# Patient Record
Sex: Female | Born: 1950
Health system: Southern US, Community
[De-identification: ages and names within clinical notes are randomized; demographics above are authoritative.]

## PROBLEM LIST (undated history)

## (undated) DIAGNOSIS — G4733 Obstructive sleep apnea (adult) (pediatric): Secondary | ICD-10-CM

## (undated) DIAGNOSIS — F330 Major depressive disorder, recurrent, mild: Secondary | ICD-10-CM

## (undated) DIAGNOSIS — E119 Type 2 diabetes mellitus without complications: Secondary | ICD-10-CM

## (undated) DIAGNOSIS — R001 Bradycardia, unspecified: Secondary | ICD-10-CM

## (undated) DIAGNOSIS — I639 Cerebral infarction, unspecified: Secondary | ICD-10-CM

## (undated) DIAGNOSIS — Z8673 Personal history of transient ischemic attack (TIA), and cerebral infarction without residual deficits: Secondary | ICD-10-CM

## (undated) DIAGNOSIS — I440 Atrioventricular block, first degree: Secondary | ICD-10-CM

## (undated) DIAGNOSIS — Z8739 Personal history of other diseases of the musculoskeletal system and connective tissue: Secondary | ICD-10-CM

## (undated) DIAGNOSIS — F3342 Major depressive disorder, recurrent, in full remission: Secondary | ICD-10-CM

## (undated) DIAGNOSIS — E785 Hyperlipidemia, unspecified: Secondary | ICD-10-CM

## (undated) DIAGNOSIS — F4310 Post-traumatic stress disorder, unspecified: Secondary | ICD-10-CM

## (undated) DIAGNOSIS — F32A Depression, unspecified: Secondary | ICD-10-CM

## (undated) DIAGNOSIS — K589 Irritable bowel syndrome without diarrhea: Secondary | ICD-10-CM

## (undated) DIAGNOSIS — G7111 Myotonic muscular dystrophy: Secondary | ICD-10-CM

## (undated) DIAGNOSIS — F329 Major depressive disorder, single episode, unspecified: Secondary | ICD-10-CM

## (undated) HISTORY — DX: Major depressive disorder, recurrent, mild: F33.0

## (undated) HISTORY — PX: OOPHORECTOMY: SHX86

## (undated) HISTORY — PX: OTHER SURGICAL HISTORY: SHX169

## (undated) HISTORY — DX: Obstructive sleep apnea (adult) (pediatric): G47.33

## (undated) HISTORY — DX: Bradycardia, unspecified: R00.1

## (undated) HISTORY — PX: RETINAL DETACHMENT SURGERY: SHX105

## (undated) HISTORY — PX: BRAIN SURGERY: SHX531

## (undated) HISTORY — DX: Hyperlipidemia, unspecified: E78.5

## (undated) HISTORY — DX: Major depressive disorder, single episode, unspecified: F32.9

## (undated) HISTORY — DX: Atrioventricular block, first degree: I44.0

## (undated) HISTORY — DX: Major depressive disorder, recurrent, in full remission: F33.42

## (undated) HISTORY — PX: COLONOSCOPY: SHX174

## (undated) HISTORY — DX: Post-traumatic stress disorder, unspecified: F43.10

## (undated) HISTORY — DX: Depression, unspecified: F32.A

## (undated) HISTORY — DX: Myotonic muscular dystrophy: G71.11

## (undated) HISTORY — DX: Personal history of other diseases of the musculoskeletal system and connective tissue: Z87.39

## (undated) HISTORY — PX: CATARACT EXTRACTION: SUR2

## (undated) HISTORY — DX: Irritable bowel syndrome without diarrhea: K58.9

## (undated) HISTORY — PX: TOTAL ABDOMINAL HYSTERECTOMY: SHX209

## (undated) HISTORY — DX: Personal history of transient ischemic attack (TIA), and cerebral infarction without residual deficits: Z86.73

---

## 2006-10-04 ENCOUNTER — Emergency Department: Payer: Self-pay | Admitting: Emergency Medicine

## 2009-07-09 ENCOUNTER — Emergency Department (HOSPITAL_COMMUNITY): Admission: EM | Admit: 2009-07-09 | Discharge: 2009-07-09 | Payer: Self-pay | Admitting: Emergency Medicine

## 2009-07-10 ENCOUNTER — Ambulatory Visit (HOSPITAL_COMMUNITY): Admission: RE | Admit: 2009-07-10 | Discharge: 2009-07-10 | Payer: Self-pay | Admitting: Emergency Medicine

## 2010-01-23 ENCOUNTER — Ambulatory Visit: Payer: Self-pay | Admitting: Family Medicine

## 2010-01-30 ENCOUNTER — Inpatient Hospital Stay: Payer: Self-pay | Admitting: Internal Medicine

## 2010-10-13 ENCOUNTER — Emergency Department: Payer: Self-pay | Admitting: Emergency Medicine

## 2011-05-21 ENCOUNTER — Encounter: Payer: Self-pay | Admitting: Family Medicine

## 2011-06-09 ENCOUNTER — Encounter: Payer: Self-pay | Admitting: Family Medicine

## 2011-10-25 ENCOUNTER — Ambulatory Visit: Payer: Self-pay | Admitting: Obstetrics and Gynecology

## 2011-10-28 ENCOUNTER — Inpatient Hospital Stay: Payer: Self-pay | Admitting: Internal Medicine

## 2011-10-28 LAB — CBC
MCH: 29.5 pg (ref 26.0–34.0)
MCV: 87 fL (ref 80–100)
Platelet: 268 10*3/uL (ref 150–440)
RDW: 14.1 % (ref 11.5–14.5)
WBC: 3.8 10*3/uL (ref 3.6–11.0)

## 2011-10-28 LAB — URINALYSIS, COMPLETE
Glucose,UR: NEGATIVE mg/dL (ref 0–75)
Nitrite: POSITIVE
Ph: 5 (ref 4.5–8.0)

## 2011-10-28 LAB — COMPREHENSIVE METABOLIC PANEL
Anion Gap: 14 (ref 7–16)
Calcium, Total: 8.9 mg/dL (ref 8.5–10.1)
EGFR (African American): >60
Glucose: 106 mg/dL — ABNORMAL HIGH (ref 65–99)
Osmolality: 287 (ref 275–301)
Potassium: 3.8 mmol/L (ref 3.5–5.1)
SGOT(AST): 54 U/L — ABNORMAL HIGH (ref 15–37)
Total Protein: 8.4 g/dL — ABNORMAL HIGH (ref 6.4–8.2)

## 2011-10-29 LAB — CBC WITH DIFFERENTIAL/PLATELET
Basophil #: 0 10*3/uL (ref 0.0–0.1)
Basophil %: 0.5 %
Eosinophil #: 0 10*3/uL (ref 0.0–0.7)
HCT: 38.2 % (ref 35.0–47.0)
HGB: 12.9 g/dL (ref 12.0–16.0)
MCH: 29.6 pg (ref 26.0–34.0)
MCHC: 33.9 g/dL (ref 32.0–36.0)
MCV: 87 fL (ref 80–100)
Monocyte #: 0.4 10*3/uL (ref 0.0–0.7)
Neutrophil %: 46.1 %
RBC: 4.37 10*6/uL (ref 3.80–5.20)
RDW: 14.2 % (ref 11.5–14.5)

## 2011-10-29 LAB — BASIC METABOLIC PANEL
BUN: 14 mg/dL (ref 7–18)
Calcium, Total: 8.3 mg/dL — ABNORMAL LOW (ref 8.5–10.1)
Creatinine: 0.98 mg/dL (ref 0.60–1.30)
EGFR (African American): >60
EGFR (Non-African Amer.): >60
Glucose: 78 mg/dL (ref 65–99)
Potassium: 3.5 mmol/L (ref 3.5–5.1)
Sodium: 143 mmol/L (ref 136–145)

## 2011-10-29 LAB — MAGNESIUM: Magnesium: 2 mg/dL

## 2011-10-30 LAB — BASIC METABOLIC PANEL
Anion Gap: 12 (ref 7–16)
Calcium, Total: 8.2 mg/dL — ABNORMAL LOW (ref 8.5–10.1)
Chloride: 104 mmol/L (ref 98–107)
Creatinine: 0.88 mg/dL (ref 0.60–1.30)
EGFR (African American): >60
EGFR (Non-African Amer.): >60
Osmolality: 285 (ref 275–301)

## 2011-10-30 LAB — CBC WITH DIFFERENTIAL/PLATELET
Basophil #: 0 10*3/uL (ref 0.0–0.1)
Eosinophil %: 1.3 %
Lymphocyte #: 1.9 10*3/uL (ref 1.0–3.6)
Lymphocyte %: 44.4 %
MCV: 87 fL (ref 80–100)
Monocyte %: 6.7 %
Neutrophil #: 2 10*3/uL (ref 1.4–6.5)
Platelet: 251 10*3/uL (ref 150–440)
RDW: 13.9 % (ref 11.5–14.5)
WBC: 4.2 10*3/uL (ref 3.6–11.0)

## 2011-10-30 LAB — URINE CULTURE

## 2011-11-01 LAB — CBC WITH DIFFERENTIAL/PLATELET
Basophil #: 0 10*3/uL (ref 0.0–0.1)
HCT: 41.2 % (ref 35.0–47.0)
Lymphocyte %: 40.9 %
MCHC: 33.7 g/dL (ref 32.0–36.0)
Neutrophil #: 2.6 10*3/uL (ref 1.4–6.5)
RBC: 4.77 10*6/uL (ref 3.80–5.20)
RDW: 13.6 % (ref 11.5–14.5)

## 2011-11-01 LAB — BASIC METABOLIC PANEL
Anion Gap: 10 (ref 7–16)
BUN: 12 mg/dL (ref 7–18)
Co2: 30 mmol/L (ref 21–32)
Creatinine: 0.88 mg/dL (ref 0.60–1.30)
EGFR (African American): >60
EGFR (Non-African Amer.): >60
Sodium: 141 mmol/L (ref 136–145)

## 2011-11-01 LAB — HEPATIC FUNCTION PANEL A (ARMC)
Albumin: 3.2 g/dL — ABNORMAL LOW (ref 3.4–5.0)
Alkaline Phosphatase: 63 U/L (ref 50–136)
Bilirubin, Direct: 0.6 mg/dL — ABNORMAL HIGH (ref 0.00–0.20)
SGOT(AST): 69 U/L — ABNORMAL HIGH (ref 15–37)
SGPT (ALT): 77 U/L
Total Protein: 7.5 g/dL (ref 6.4–8.2)

## 2011-11-02 LAB — BASIC METABOLIC PANEL
Calcium, Total: 8.7 mg/dL (ref 8.5–10.1)
Glucose: 80 mg/dL (ref 65–99)
Potassium: 3.7 mmol/L (ref 3.5–5.1)
Sodium: 143 mmol/L (ref 136–145)

## 2011-11-02 LAB — CBC WITH DIFFERENTIAL/PLATELET
Basophil #: 0 10*3/uL (ref 0.0–0.1)
Eosinophil #: 0.1 10*3/uL (ref 0.0–0.7)
MCH: 29.2 pg (ref 26.0–34.0)
MCHC: 34 g/dL (ref 32.0–36.0)
Monocyte #: 0.4 10*3/uL (ref 0.0–0.7)
Neutrophil %: 45.7 %
Platelet: 294 10*3/uL (ref 150–440)
RDW: 13.9 % (ref 11.5–14.5)
WBC: 5.2 10*3/uL (ref 3.6–11.0)

## 2011-11-02 LAB — HEPATIC FUNCTION PANEL A (ARMC)
Bilirubin, Direct: 0.4 mg/dL — ABNORMAL HIGH (ref 0.00–0.20)
Bilirubin,Total: 0.9 mg/dL (ref 0.2–1.0)
SGPT (ALT): 66 U/L

## 2011-11-03 LAB — BASIC METABOLIC PANEL
Calcium, Total: 8.7 mg/dL (ref 8.5–10.1)
Creatinine: 1 mg/dL (ref 0.60–1.30)
EGFR (African American): >60
EGFR (Non-African Amer.): >60
Glucose: 85 mg/dL (ref 65–99)
Potassium: 3.3 mmol/L — ABNORMAL LOW (ref 3.5–5.1)
Sodium: 144 mmol/L (ref 136–145)

## 2011-11-03 LAB — CLOSTRIDIUM DIFFICILE BY PCR

## 2011-11-03 LAB — CBC WITH DIFFERENTIAL/PLATELET
Basophil #: 0 10*3/uL (ref 0.0–0.1)
Eosinophil #: 0.1 10*3/uL (ref 0.0–0.7)
Eosinophil %: 3 %
HCT: 41.6 % (ref 35.0–47.0)
HGB: 13.8 g/dL (ref 12.0–16.0)
MCH: 29 pg (ref 26.0–34.0)
MCHC: 33.2 g/dL (ref 32.0–36.0)
MCV: 87 fL (ref 80–100)
Monocyte #: 0.3 10*3/uL (ref 0.0–0.7)
Neutrophil #: 2.2 10*3/uL (ref 1.4–6.5)
Neutrophil %: 46.9 %
Platelet: 293 10*3/uL (ref 150–440)
RBC: 4.77 10*6/uL (ref 3.80–5.20)

## 2011-11-03 LAB — WBCS, STOOL

## 2011-12-04 ENCOUNTER — Ambulatory Visit: Payer: Self-pay | Admitting: Internal Medicine

## 2011-12-06 ENCOUNTER — Ambulatory Visit: Payer: Self-pay | Admitting: Internal Medicine

## 2011-12-11 ENCOUNTER — Emergency Department: Payer: Self-pay | Admitting: Emergency Medicine

## 2011-12-11 LAB — COMPREHENSIVE METABOLIC PANEL
Albumin: 3.8 g/dL (ref 3.4–5.0)
Alkaline Phosphatase: 83 U/L (ref 50–136)
Anion Gap: 15 (ref 7–16)
BUN: 23 mg/dL — ABNORMAL HIGH (ref 7–18)
Bilirubin,Total: 1 mg/dL (ref 0.2–1.0)
Calcium, Total: 9.7 mg/dL (ref 8.5–10.1)
Chloride: 99 mmol/L (ref 98–107)
Co2: 26 mmol/L (ref 21–32)
EGFR (Non-African Amer.): >60
Osmolality: 284 (ref 275–301)
Potassium: 3.8 mmol/L (ref 3.5–5.1)
SGPT (ALT): 52 U/L

## 2011-12-11 LAB — CBC
HGB: 16.7 g/dL — ABNORMAL HIGH (ref 12.0–16.0)
Platelet: 300 10*3/uL (ref 150–440)
RBC: 5.72 10*6/uL — ABNORMAL HIGH (ref 3.80–5.20)
RDW: 14 % (ref 11.5–14.5)
WBC: 6.1 10*3/uL (ref 3.6–11.0)

## 2011-12-11 LAB — RAPID INFLUENZA A&B ANTIGENS

## 2012-05-14 ENCOUNTER — Ambulatory Visit: Payer: Self-pay | Admitting: Obstetrics and Gynecology

## 2012-05-23 IMAGING — MG MM CAD SCREENING MAMMO
1 series · 6 of 6 positions shown · non-contrast
Comparison: none

REASON FOR EXAM: scr mammo no order
COMMENTS:

[Series 1259: R CC · right · 6 of 6 slices shown]
[im 1/6]
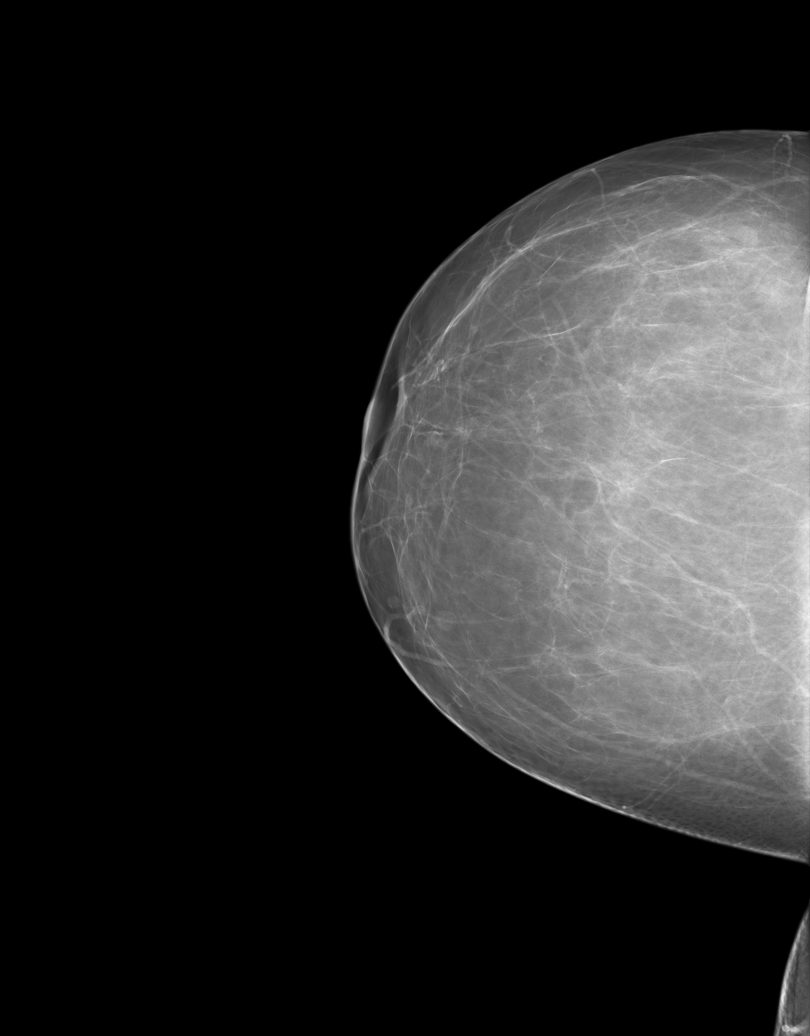
[im 2/6]
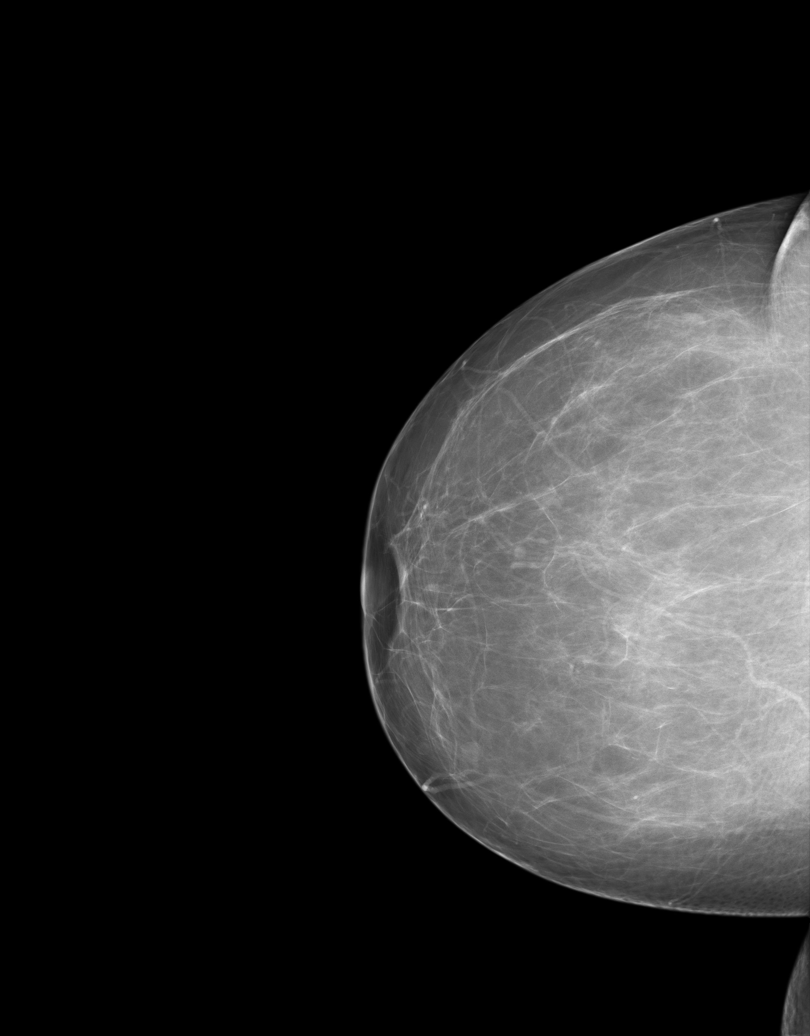
[im 3/6]
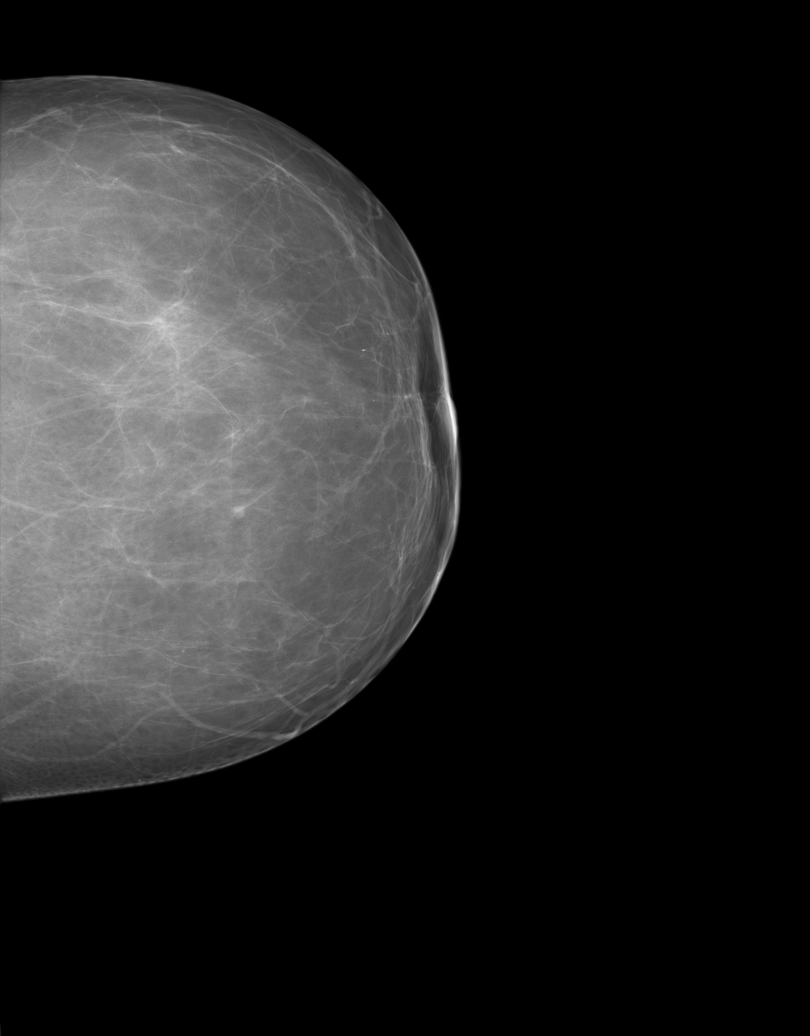
[im 4/6]
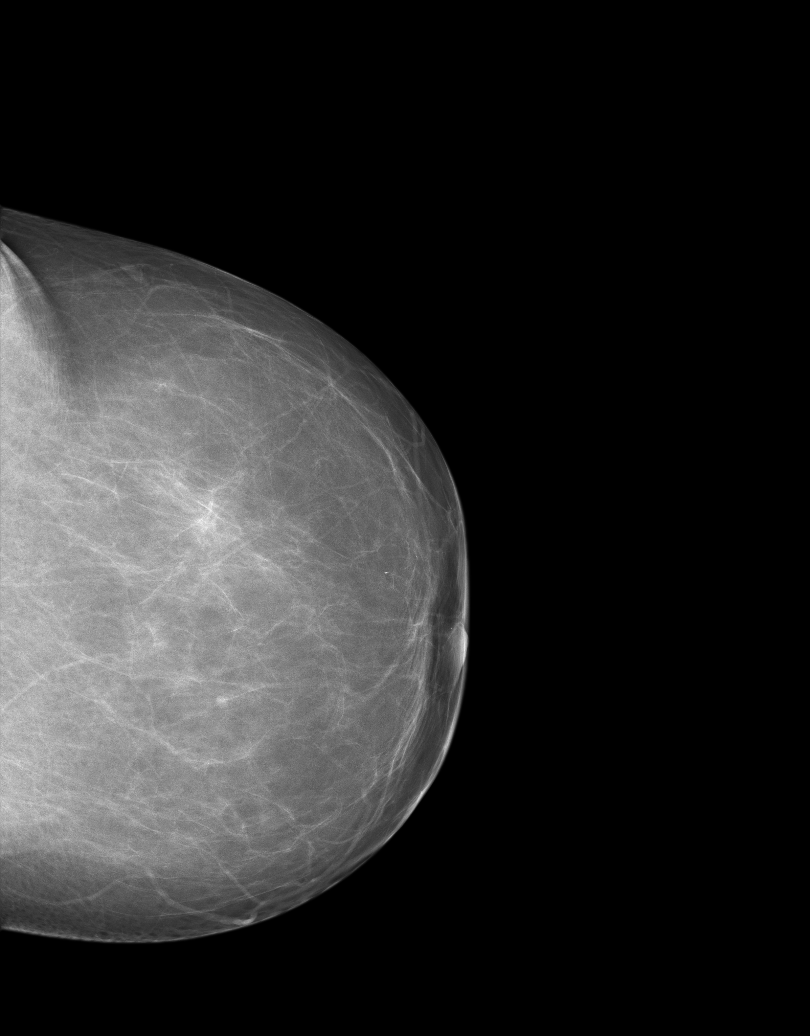
[im 5/6]
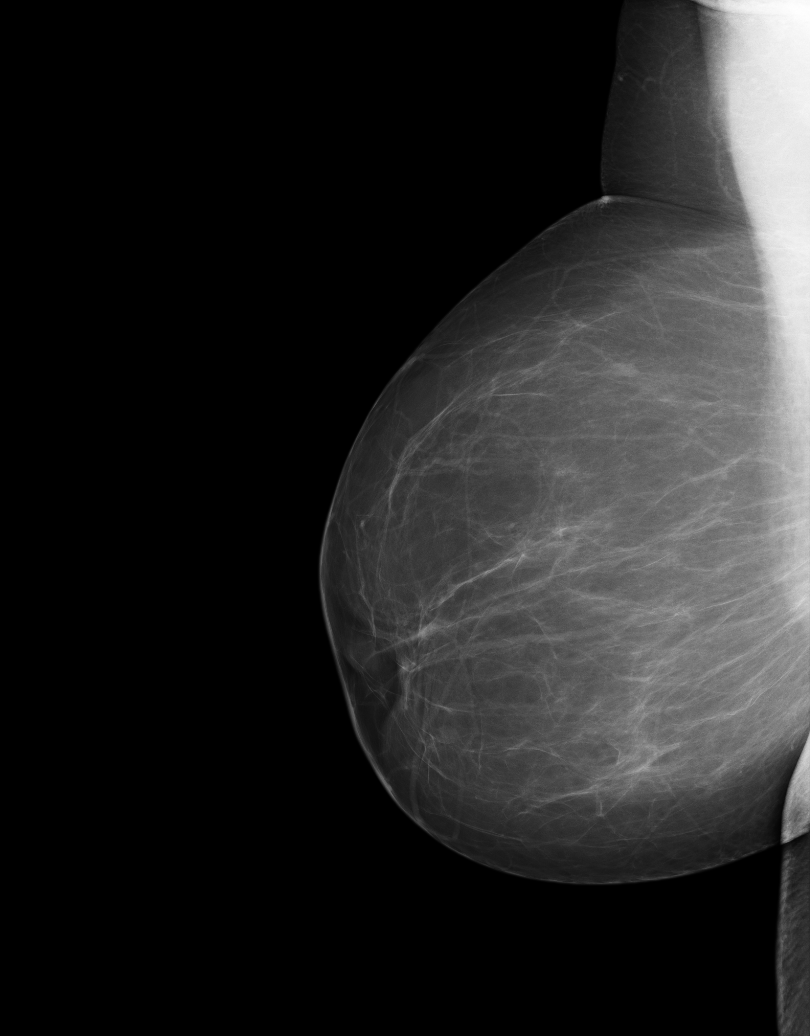
[im 6/6]
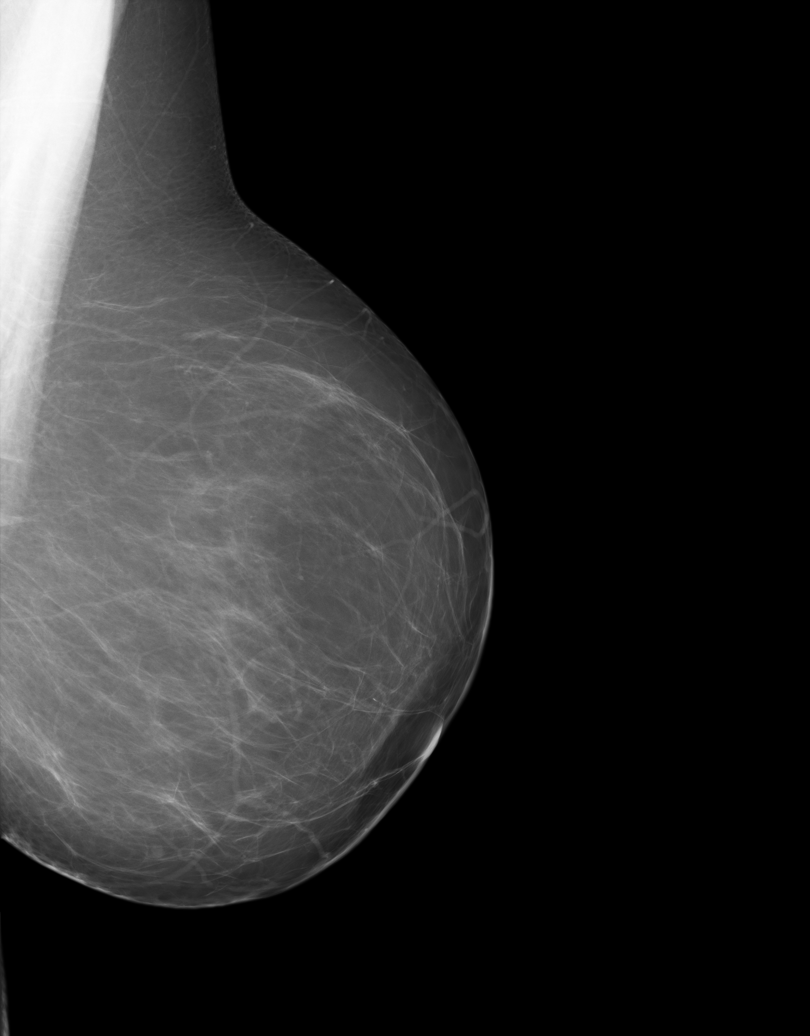

[6 of 6 positions shown; findings below may reference images not displayed]

PROCEDURE:     MAM - MAM DGTL SCRN MAM NO ORDER W/CAD  - December 04, 2011 [DATE]

RESULT:

No dominant masses or pathologic clustered calcifications demonstrated.
Nodular densities noted along the medial portion of the right breast. It is
suggested the patient return for compression spot films for further
evaluation. Nodularity noted elsewhere in both breasts is stable. The
breasts are primarily fatty replaced. No pathologic calcifications noted.
IMPRESSION: Nodular densities noted in the medial portion of the right
breast for which compression spot films and, if need be, ultrasound is
suggested for further evaluation.

BI-RADS:  Category 0 - Needs Additional Imaging Evaluation

Thank you for this opportunity to contribute to the care of your patient.

A NEGATIVE MAMMOGRAM REPORT DOES NOT PRECLUDE BIOPSY OR OTHER EVALUATION OF
A CLINICALLY PALPABLE OR OTHERWISE SUSPICIOUS MASS OR LESION. BREAST CANCER
MAY NOT BE DETECTED BY MAMMOGRAPHY IN UP TO 10% OF CASES.

## 2012-05-30 IMAGING — CR DG CHEST 2V
1 series · 2 of 2 positions shown · non-contrast
Comparison: none

REASON FOR EXAM: cough and desaturations
COMMENTS:

[Series 1: x chest ap · 0.14mm/px · 2 of 2 slices shown]
[im 1/2]
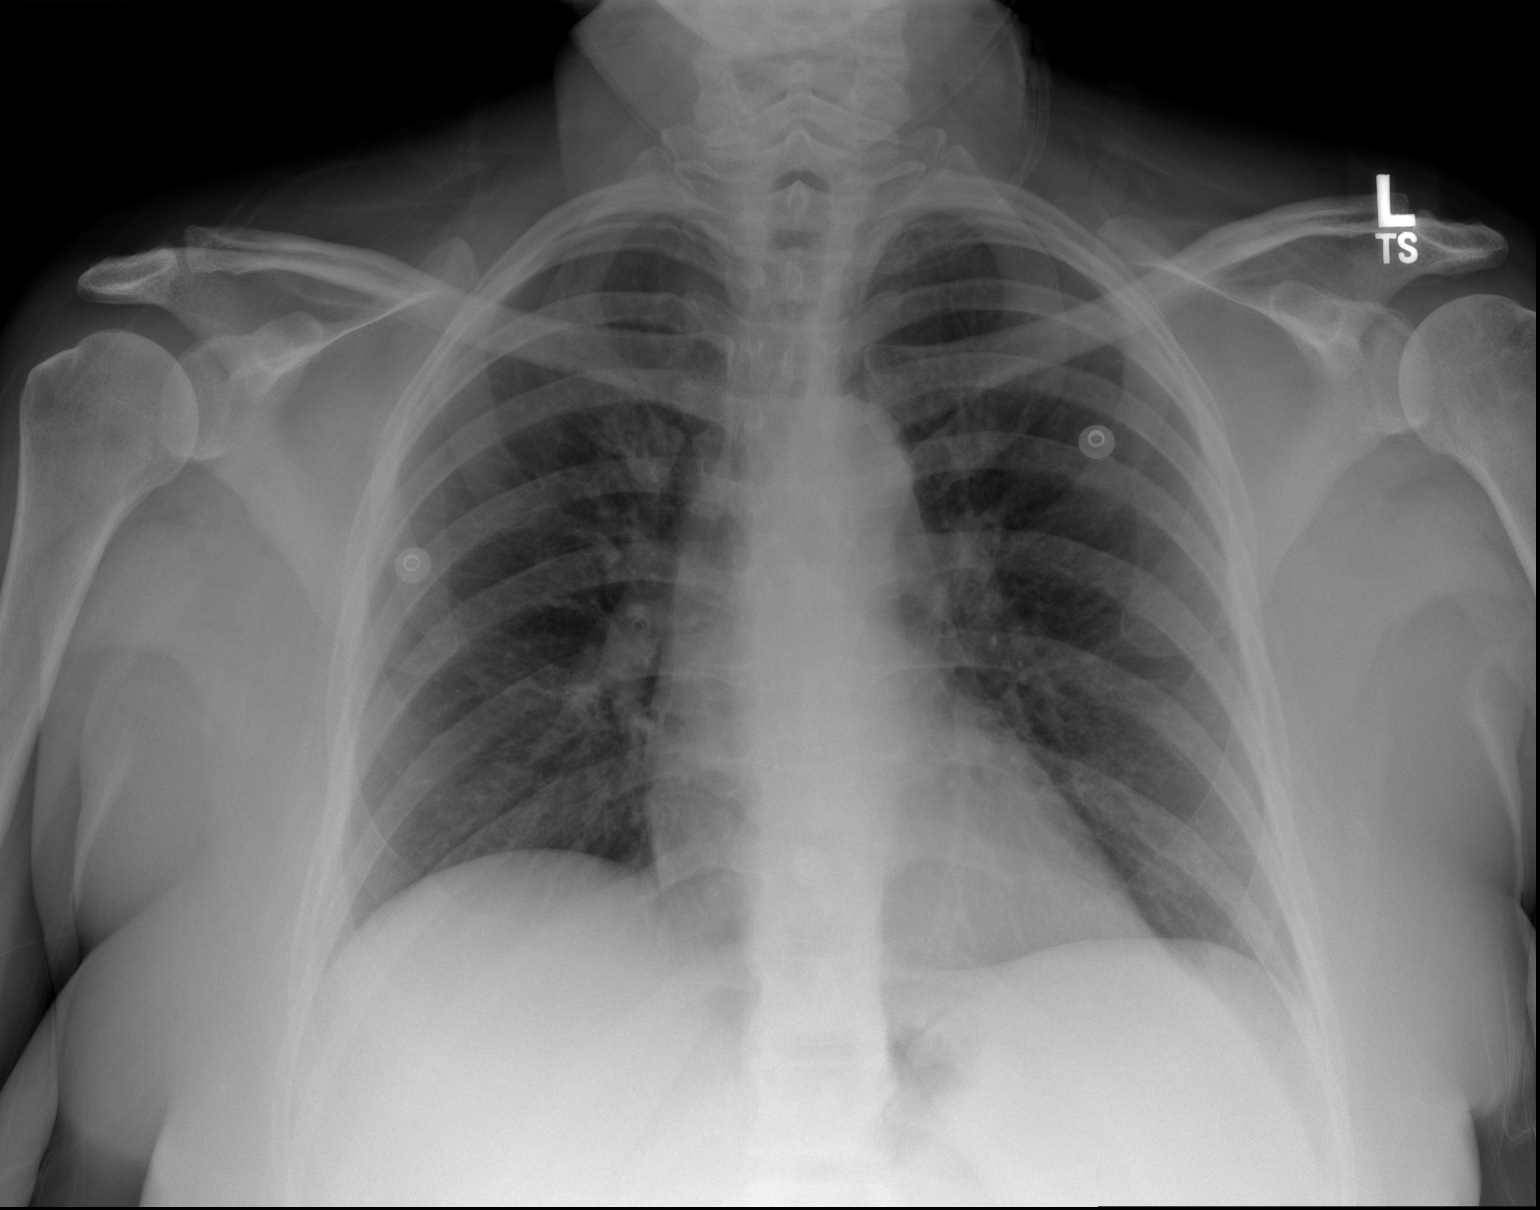
[im 2/2]
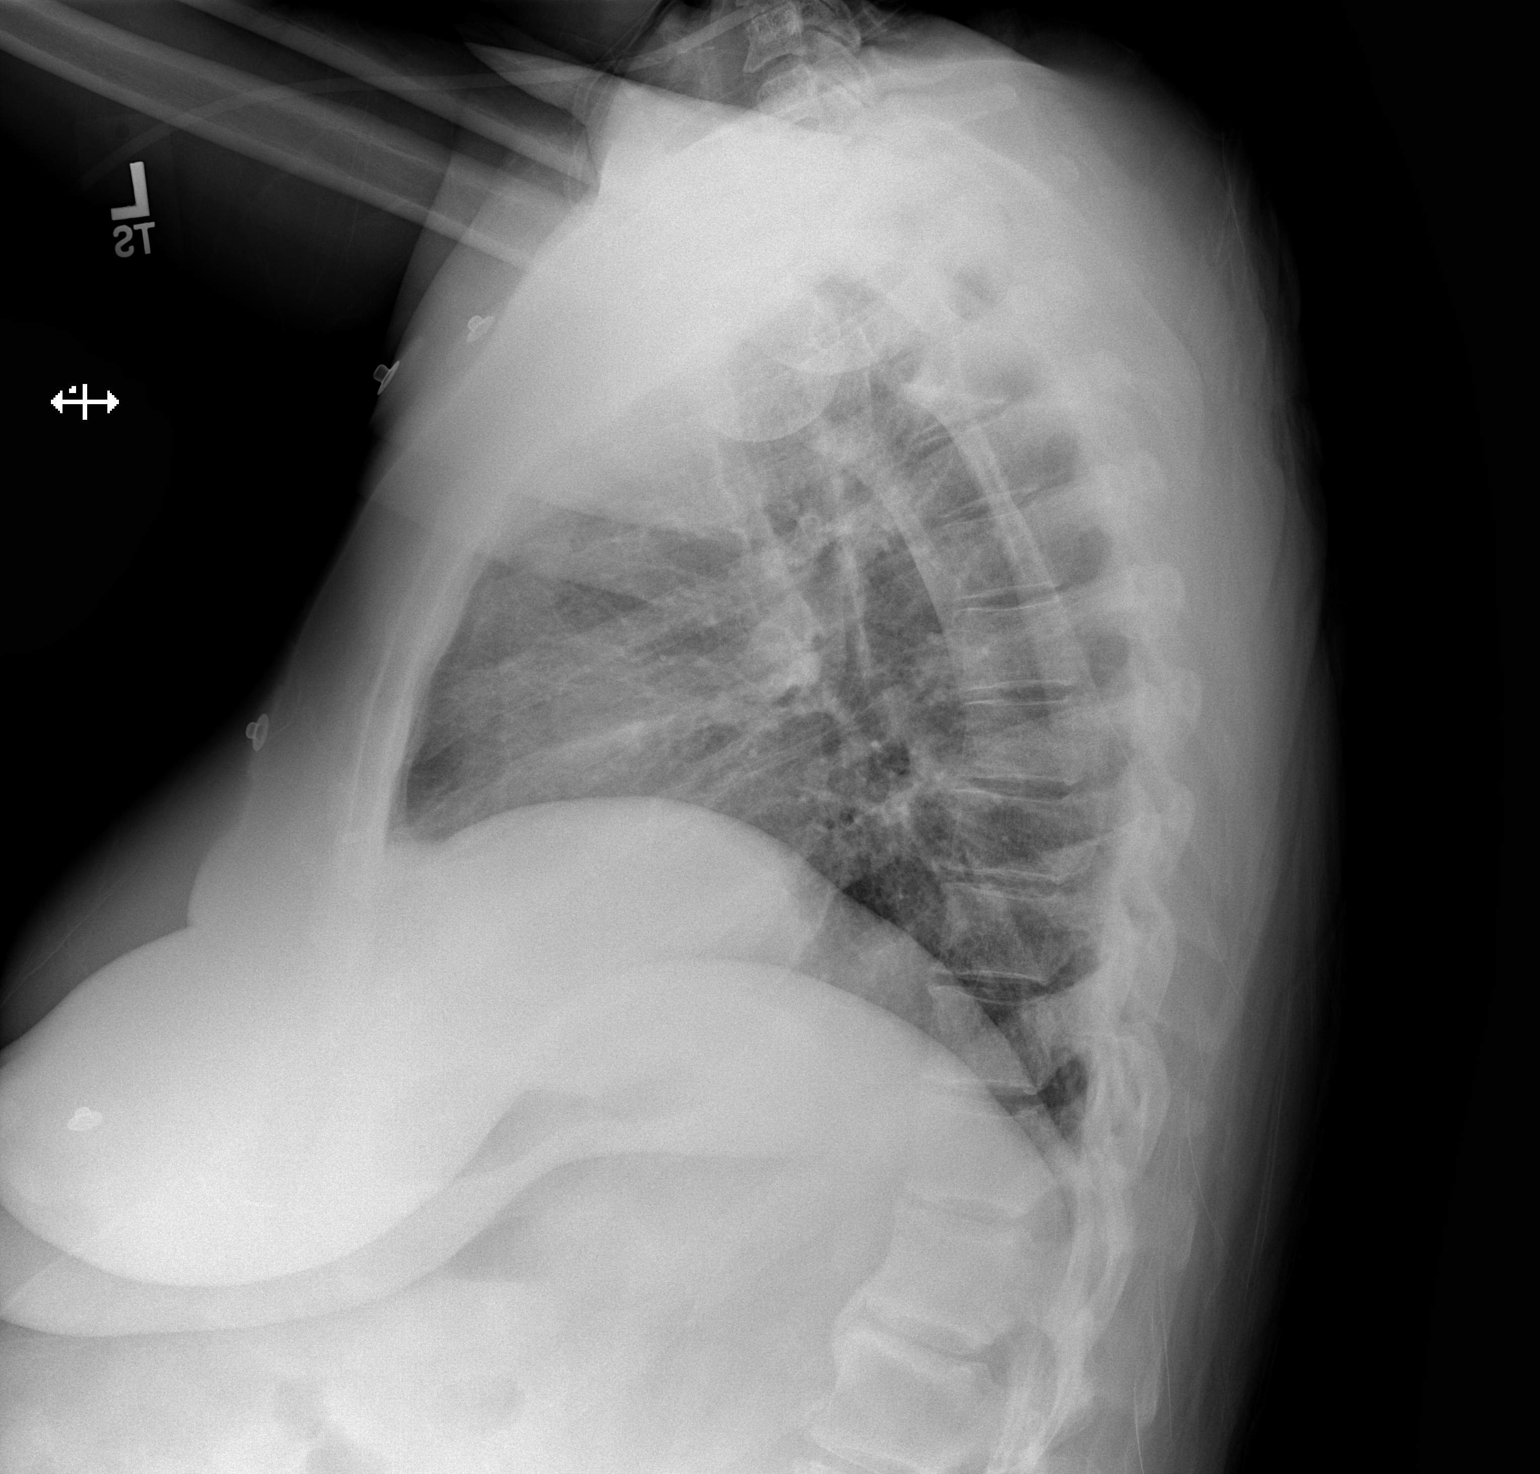

[2 of 2 positions shown; findings below may reference images not displayed]

PROCEDURE:     DXR - DXR CHEST PA (OR AP) AND LATERAL  - December 11, 2011 [DATE]

RESULT:     Comparison made to the previous exam dated 28 October, 2011.

The lungs are clear. The heart and pulmonary vessels are normal. The bony
and mediastinal structures are unremarkable. There is no effusion. There is
no pneumothorax or evidence of congestive failure.
IMPRESSION: No acute cardiopulmonary disease.

## 2012-09-19 DIAGNOSIS — G7111 Myotonic muscular dystrophy: Secondary | ICD-10-CM | POA: Insufficient documentation

## 2012-10-10 DIAGNOSIS — G4733 Obstructive sleep apnea (adult) (pediatric): Secondary | ICD-10-CM | POA: Insufficient documentation

## 2012-10-10 DIAGNOSIS — M889 Osteitis deformans of unspecified bone: Secondary | ICD-10-CM | POA: Insufficient documentation

## 2012-10-10 DIAGNOSIS — E785 Hyperlipidemia, unspecified: Secondary | ICD-10-CM | POA: Insufficient documentation

## 2012-10-10 DIAGNOSIS — K589 Irritable bowel syndrome without diarrhea: Secondary | ICD-10-CM | POA: Insufficient documentation

## 2012-10-10 DIAGNOSIS — Z8673 Personal history of transient ischemic attack (TIA), and cerebral infarction without residual deficits: Secondary | ICD-10-CM | POA: Insufficient documentation

## 2012-10-10 DIAGNOSIS — F3340 Major depressive disorder, recurrent, in remission, unspecified: Secondary | ICD-10-CM | POA: Insufficient documentation

## 2012-10-10 DIAGNOSIS — F329 Major depressive disorder, single episode, unspecified: Secondary | ICD-10-CM | POA: Insufficient documentation

## 2012-10-10 DIAGNOSIS — R0602 Shortness of breath: Secondary | ICD-10-CM | POA: Insufficient documentation

## 2012-10-30 DIAGNOSIS — R001 Bradycardia, unspecified: Secondary | ICD-10-CM | POA: Insufficient documentation

## 2012-10-30 DIAGNOSIS — I499 Cardiac arrhythmia, unspecified: Secondary | ICD-10-CM | POA: Insufficient documentation

## 2012-12-04 ENCOUNTER — Ambulatory Visit: Payer: Self-pay | Admitting: Internal Medicine

## 2013-03-25 ENCOUNTER — Ambulatory Visit: Payer: Self-pay | Admitting: Physical Medicine and Rehabilitation

## 2013-08-18 ENCOUNTER — Ambulatory Visit: Payer: Self-pay | Admitting: Gastroenterology

## 2013-09-25 DIAGNOSIS — I440 Atrioventricular block, first degree: Secondary | ICD-10-CM | POA: Insufficient documentation

## 2013-10-16 ENCOUNTER — Encounter (INDEPENDENT_AMBULATORY_CARE_PROVIDER_SITE_OTHER): Payer: Self-pay

## 2013-10-16 ENCOUNTER — Ambulatory Visit (INDEPENDENT_AMBULATORY_CARE_PROVIDER_SITE_OTHER): Payer: Medicare PPO | Admitting: Cardiovascular Disease

## 2013-10-16 ENCOUNTER — Encounter: Payer: Self-pay | Admitting: *Deleted

## 2013-10-16 VITALS — BP 108/72 | HR 63 | Ht 67.5 in | Wt 190.2 lb

## 2013-10-16 DIAGNOSIS — R001 Bradycardia, unspecified: Secondary | ICD-10-CM | POA: Insufficient documentation

## 2013-10-16 DIAGNOSIS — I639 Cerebral infarction, unspecified: Secondary | ICD-10-CM

## 2013-10-16 DIAGNOSIS — E785 Hyperlipidemia, unspecified: Secondary | ICD-10-CM

## 2013-10-16 DIAGNOSIS — I6350 Cerebral infarction due to unspecified occlusion or stenosis of unspecified cerebral artery: Secondary | ICD-10-CM

## 2013-10-16 DIAGNOSIS — R0602 Shortness of breath: Secondary | ICD-10-CM

## 2013-10-16 DIAGNOSIS — G7111 Myotonic muscular dystrophy: Secondary | ICD-10-CM

## 2013-10-16 DIAGNOSIS — I498 Other specified cardiac arrhythmias: Secondary | ICD-10-CM

## 2013-10-16 NOTE — Progress Notes (Signed)
Patient ID: Rebekah Warner, female    DOB: August 20, 1951, 63 y.o.   MRN: [id number]  HPI Comments: Patient profile: Rebekah Warner is an 63 y.o. 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 Prior history of Paget's disease with complications and prolonged hospital course. Hyperlipidemia, CVA after a fall while in the rehabilitation. Her son died unexpectedly in sleep in 05-17-13 She had cognitive impairment after stroke, but that is getting better, still short term memory problems. She has trouble speaking getting the words out. She has muscle pains in upper arms, stiffness and pain in right shoulder, neck., hands hurt at night. She fell few times since the last visit, walks in leaves with a hole, stumbled and fell, she bruised her knees. If she falls she cannot get up on her own, she cannot bend over.  Swallowing more difficult, feels like propulsion is very slow, chokes easily during sleep and coughs then a lot, likely phlegm aspiration, no respiratory infections.  Previously seen at [facility]. Much of the details below provided from the [facility] hospital notes (care anywhere). She reports that she has had progressive difficulty walking, now requires someone to walk with in addition to a walker. She quit working in 1998 as she was unable to walk well. She has been told she has low heart rate for many years, in general has chronic fatigue, shortness of breath, worse with exertion. She does not feel particular asymptomatic from her bradycardia. She reports that she has had a Holter every year that has shown bradycardia. Heart rate seems to improve with exertion. Started back on her CPAP in September 2014 with a new machine. Seems to be tolerating this well. She feels better, no wheezing, less fatigued during the day. Her  family members also have MD. No episodes of syncope. Does have occasional lightheadedness.  EKG on today's visit shows normal sinus rhythm with rate 63 beats per minute, low voltage Recent lab work shows total cholesterol 156, LDL 77, triglycerides 217  Problem List: 1. Shortness of breath, chest pains a. Stress MRI 04/14/2009: Normal LVEF. No ischemia or infarction. No scar or infiltrative disease. b. MRI 10/2012: EF 65%, no scar, mild AI. 2. Myotonic muscular dystrophy type 1, based on genetic testing, EMG, and exam ECG a. 04/01/2009: SR, 73/min, PR 172, noncpecific ST elevation V1-2 b. 12/04/2009: SR, 97/min, PR 176, nonspecific T changes I, avL, V2-6. c. 09/19/2012: new AV block type 1 with PR of 204 ms, and sinus bradycardia 47/min, nonspecific T changes I, avL, V2,  d. 04/17/2013: AV block type 1 with PR of 208 ms, and sinus rhythm 58/min, nonspecific T changes I, avL, V2, HOLTER a. 04/30/2008: SR, sinus-bradycardia, min 46/min, max. 96/min, avg 62/min, 15 PACs, 8 atrial pairs. b. 04/14/2009: SR, sinus-bradycardia, min 47/min, max. 105/min, avg 66/min, 16 PACs, 3 atrial pairs, 2 runs of atrial tachycardia, 5 beats @114 /min, no pauses >2s c. 10/10/2012: SR, sinus-bradycardia, min 45/min, max. 88/min, avg 59/min, 73 PACs, 7 atrial pairs, 4 runs of AT max 4 beats max. 138/min, no other supraventricular or ventricular arrhythmia. no pauses >2s, max RR 1.4s, d. 09/16/2013: SR, sinus-bradycardia, min 47/min, max. 100/min, avg 67/min, AV block I, 3PVCs 29 PACs, 7 atrial pairs, 15 runs of AT max 4 beats max. 133/min, no other supraventricular or ventricular arrhythmia. no pauses >2s, max  RR 1.4s, 3. Mixed hyperlipidemia / hypertriglyceridemia  4. Depression  5. Paget disease s/p retromastoid craniotomy 11/2009 of hyperostosis complicated by postoperative cereballar hematoma and left occipital lobe ischemic infaction, recurrent infarction 02/2010 6. Obstructive sleep apnea Titrated to BIPAP 25/20 cmH2O,  started 06/2013  PAST SURGICAL HISTORY:  1. Endometriosis status post hysterectomy / ovarectomy 1993  2. Cataract laser surgery 2002   Social History: Patient is married, is disabled due to her muscular weakness. She denies regular intake of alcoholic beverages, does not smoke, follows a fat, sugar, salt restricted diet. She is not able to exercise.   Family History: She is adopted and her family history is noncontributory. She does have three children. Two daughters who both are diagnosed with myotonic muscular dystrophy and a son who does not have muscular dystrophy, suddenly during sleep at age 26 in August of this year.   Outpatient Encounter Prescriptions as of 10/16/2013  Medication Sig  . buPROPion (WELLBUTRIN XL) 150 MG 24 hr tablet Take 150 mg by mouth daily.  Marland Kitchen ezetimibe (ZETIA) 10 MG tablet Take 10 mg by mouth daily.  . fenofibrate 160 MG tablet Take 160 mg by mouth daily.  . pregabalin (LYRICA) 100 MG capsule Take 100 mg by mouth daily.  . QUEtiapine (SEROQUEL) 25 MG tablet Take 25 mg by mouth at bedtime.  . sertraline (ZOLOFT) 100 MG tablet Take 100 mg by mouth daily.  . traMADol (ULTRAM) 50 MG tablet Take by mouth every 6 (six) hours as needed.  . zolpidem (AMBIEN) 5 MG tablet Take 5 mg by mouth at bedtime as needed for sleep.     Review of Systems  Constitutional: Negative.   HENT: Negative.   Eyes: Negative.   Respiratory: Positive for shortness of breath.   Cardiovascular: Negative.   Gastrointestinal: Negative.   Endocrine: Negative.   Musculoskeletal: Positive for gait problem and myalgias.  Skin: Negative.   Allergic/Immunologic: Negative.   Neurological: Negative.   Hematological: Negative.   Psychiatric/Behavioral: Negative.     BP 108/72  Pulse 63  Ht 5' 7.5" (1.715 m)  Wt 190 lb 4 oz (86.297 kg)  BMI 29.34 kg/m2  Physical Exam  Nursing note and vitals reviewed. Constitutional: She is oriented to person, place, and time. She appears  well-developed and well-nourished.  HENT:  Head: Normocephalic.  Nose: Nose normal.  Mouth/Throat: Oropharynx is clear and moist.  Eyes: Conjunctivae are normal. Pupils are equal, round, and reactive to light.  Neck: Normal range of motion. Neck supple. No JVD present.  Cardiovascular: Normal rate, regular rhythm, S1 normal, S2 normal, normal heart sounds and intact distal pulses.  Exam reveals no gallop and no friction rub.   No murmur heard. Pulmonary/Chest: Effort normal and breath sounds normal. No respiratory distress. She has no wheezes. She has no rales. She exhibits no tenderness.  Abdominal: Soft. Bowel sounds are normal. She exhibits no distension. There is no tenderness.  Musculoskeletal: Normal range of motion. She exhibits no edema and no tenderness.  Lymphadenopathy:    She has no cervical adenopathy.  Neurological: She is alert and oriented to person, place, and time. Coordination normal.  Skin: Skin is warm and dry. No rash noted. No erythema.  Psychiatric: She has a normal mood and affect. Her behavior is normal. Judgment and thought content normal.    Assessment and Plan

## 2013-10-16 NOTE — Assessment & Plan Note (Addendum)
Assessment and Plan: 1. Conduction abnormalities - Per [facility] notes, she has a Hx of AV conduction problems and sinus bradycardia ( noted in 10/2012). Hx of sleep apnea, and she previously not  using her CPAP. referral to pulmonology in 2014 with assessment and adjustment of her CPAP machine, she saw Dr Kathline Magic and received  a CPAP machine in 06/2013 which she uses regularly.  Previous 48 hr Holter monitor SR, sinus-bradycardia, min 47/min, max. 100/min, avg 67/min, AV block I, 3PVCs 29 PACs, 7 atrial pairs, 15 runs of AT max 4 beats max. 133/min, no other supraventricular or ventricular arrhythmia. no pauses >2s, max RR 1.4s.  She has no Class I indication for PM (no third degree or advanced second-degree block, and no symptoms attributable to bradycardia.   Neuromuscular disease and any degree of AV-block, or bifascicular/any fascicular block, with or without symptoms are Class IIb indication for PM.  previous MRI at [facility] showed  no evidence of cardiomyopathy. Suggested we continue with watchful waiting and ECG / Holter as needed for symptoms.

## 2013-10-16 NOTE — Patient Instructions (Signed)
You are doing well. No medication changes were made.  Please call us if you have new issues that need to be addressed before your next appt.  Your physician wants you to follow-up in: 12 months.  You will receive a reminder letter in the mail two months in advance. If you don't receive a letter, please call our office to schedule the follow-up appointment. 

## 2013-10-22 ENCOUNTER — Encounter: Payer: Self-pay | Admitting: *Deleted

## 2013-11-02 DIAGNOSIS — G7111 Myotonic muscular dystrophy: Secondary | ICD-10-CM | POA: Insufficient documentation

## 2013-11-02 DIAGNOSIS — I639 Cerebral infarction, unspecified: Secondary | ICD-10-CM | POA: Insufficient documentation

## 2013-11-02 DIAGNOSIS — E785 Hyperlipidemia, unspecified: Secondary | ICD-10-CM | POA: Insufficient documentation

## 2013-11-02 DIAGNOSIS — I6350 Cerebral infarction due to unspecified occlusion or stenosis of unspecified cerebral artery: Secondary | ICD-10-CM | POA: Insufficient documentation

## 2013-11-02 NOTE — Assessment & Plan Note (Signed)
Continue on zetia. Avoiding statins secondary to current muscle weakness, myotonic muscular dystrophy.

## 2013-11-02 NOTE — Assessment & Plan Note (Signed)
She has muscle pains in upper arms, stiffness and pain in right shoulder, neck., hands hurt at night. Walks with a walker, support from other people.  Diagnosis in 1998. Stopped work at that time.

## 2013-11-02 NOTE — Assessment & Plan Note (Addendum)
She had cognitive impairment after stroke, but that is getting better, still short term memory problems. She has trouble speaking getting the words out.  Slow recovery. No documentation of PVD or arrhythmia.

## 2013-12-07 ENCOUNTER — Telehealth: Payer: Self-pay

## 2013-12-07 ENCOUNTER — Ambulatory Visit: Payer: Self-pay | Admitting: Family Medicine

## 2013-12-07 NOTE — Telephone Encounter (Signed)
Pt called and states he neurologist(Dr. Melrose Nakayama) wants her to start taking a medication to help relax her muscles, he advised that pt check with [place] before he prescribes. Please call.

## 2013-12-07 NOTE — Telephone Encounter (Signed)
Spoke w/ pt.  She reports that the name of the med is "nexilitine".

## 2013-12-08 NOTE — Telephone Encounter (Signed)
She has had conduction issues before I wonder if she is talking about mexilitine? If this is the case, I would be concerned. If she needs to take his medication, she may want to talk with one of our electrical doctor's first, Dr. Caryl Comes in clinic Uncertain if this medication could worsen her conduction block

## 2013-12-09 NOTE — Telephone Encounter (Signed)
Left message for pt to call back  °

## 2013-12-10 NOTE — Telephone Encounter (Signed)
Spoke w/ pt.  Advised her of Dr. Donivan Scull recommendation. She is agreeable and states that she will not take the medication.

## 2014-03-02 ENCOUNTER — Ambulatory Visit: Payer: Self-pay

## 2014-03-08 ENCOUNTER — Ambulatory Visit: Payer: Self-pay

## 2014-04-07 ENCOUNTER — Ambulatory Visit: Payer: Self-pay

## 2014-04-26 DIAGNOSIS — E669 Obesity, unspecified: Secondary | ICD-10-CM | POA: Insufficient documentation

## 2014-04-26 DIAGNOSIS — R5383 Other fatigue: Secondary | ICD-10-CM | POA: Insufficient documentation

## 2014-04-26 DIAGNOSIS — H539 Unspecified visual disturbance: Secondary | ICD-10-CM | POA: Insufficient documentation

## 2014-05-08 ENCOUNTER — Ambulatory Visit: Payer: Self-pay

## 2014-08-11 DIAGNOSIS — R7303 Prediabetes: Secondary | ICD-10-CM | POA: Insufficient documentation

## 2014-08-11 DIAGNOSIS — G4700 Insomnia, unspecified: Secondary | ICD-10-CM | POA: Insufficient documentation

## 2014-08-11 DIAGNOSIS — E119 Type 2 diabetes mellitus without complications: Secondary | ICD-10-CM | POA: Insufficient documentation

## 2014-09-29 DIAGNOSIS — G119 Hereditary ataxia, unspecified: Secondary | ICD-10-CM | POA: Insufficient documentation

## 2014-10-13 DIAGNOSIS — G4733 Obstructive sleep apnea (adult) (pediatric): Secondary | ICD-10-CM | POA: Diagnosis not present

## 2014-10-18 ENCOUNTER — Ambulatory Visit (INDEPENDENT_AMBULATORY_CARE_PROVIDER_SITE_OTHER): Payer: Commercial Managed Care - HMO | Admitting: Cardiovascular Disease

## 2014-10-18 ENCOUNTER — Encounter: Payer: Self-pay | Admitting: Cardiovascular Disease

## 2014-10-18 ENCOUNTER — Encounter (INDEPENDENT_AMBULATORY_CARE_PROVIDER_SITE_OTHER): Payer: Self-pay

## 2014-10-18 VITALS — BP 110/72 | HR 65 | Ht 67.5 in | Wt 193.5 lb

## 2014-10-18 DIAGNOSIS — R0602 Shortness of breath: Secondary | ICD-10-CM | POA: Diagnosis not present

## 2014-10-18 DIAGNOSIS — G7111 Myotonic muscular dystrophy: Secondary | ICD-10-CM | POA: Diagnosis not present

## 2014-10-18 DIAGNOSIS — E119 Type 2 diabetes mellitus without complications: Secondary | ICD-10-CM

## 2014-10-18 DIAGNOSIS — I639 Cerebral infarction, unspecified: Secondary | ICD-10-CM | POA: Diagnosis not present

## 2014-10-18 DIAGNOSIS — R001 Bradycardia, unspecified: Secondary | ICD-10-CM | POA: Diagnosis not present

## 2014-10-18 DIAGNOSIS — E785 Hyperlipidemia, unspecified: Secondary | ICD-10-CM | POA: Diagnosis not present

## 2014-10-18 NOTE — Assessment & Plan Note (Signed)
Dramatic improvement of her symptoms.

## 2014-10-18 NOTE — Assessment & Plan Note (Signed)
Cholesterol is at goal on the current lipid regimen. No changes to the medications were made.  

## 2014-10-18 NOTE — Assessment & Plan Note (Signed)
Heart rate well controlled. No clinical signs of symptom at bradycardia. No further testing at this time

## 2014-10-18 NOTE — Patient Instructions (Signed)
You are doing well. No medication changes were made.  Please call us if you have new issues that need to be addressed before your next appt.  Your physician wants you to follow-up in: 12 months.  You will receive a reminder letter in the mail two months in advance. If you don't receive a letter, please call our office to schedule the follow-up appointment. 

## 2014-10-18 NOTE — Assessment & Plan Note (Signed)
Diet-controlled diabetes. Improved by changing her diet

## 2014-10-18 NOTE — Assessment & Plan Note (Addendum)
Significant progression of her symptoms over the past year. Requiring more assistance. Recommended a now a wheelchair to get around the house. Symptoms mildly improved on Lyrica

## 2014-10-18 NOTE — Progress Notes (Signed)
Patient ID: Rebekah Warner, female    DOB: 1951-06-06, 64 y.o.   MRN: [id number]  HPI Comments: Patient profile: Rebekah Warner is an 64 y.o. 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 Prior history of Paget's disease with complications and prolonged hospital course. Hyperlipidemia, CVA after a fall while in the rehabilitation. Her son died unexpectedly in sleep in 01-Jul-2013 She had cognitive impairment after stroke, but has made a strong recovery  In follow-up today, she reports that her leg strength and muscle strength in general is much worse over the past year. She is unable to walk without assistance. Has been at home uses a lift and she uses a motorized scooter to get out of the house. Was told that she had diabetes with hemoglobin A1c greater than 7. With dietary changes, this improved. She was started on diabetes medications but had significant side effects of diarrhea, nausea vomiting. Now does not take any medication She reports a hemoglobin A1c 5.5 Recent lab work showing total cholesterol 157, LDL 74, triglycerides 220, normal LFTs, hemoglobin A1c 5.9 EKG on today's visit shows normal sinus rhythm with rate 65 bpm, no significant ST wave changes, T wave abnormality in 1 and aVL, unchanged  Other past medical history  She has muscle pains in upper arms, stiffness and pain in right shoulder, neck., hands hurt at night. She fell few times since the last visit, walks in leaves with a hole, stumbled and fell, she bruised her knees. If she falls she cannot get up on her own, she cannot bend over.  Swallowing more difficult, feels like propulsion is very slow, chokes easily during sleep and coughs then a lot, likely phlegm aspiration, no respiratory infections.  Previously seen at [facility]. Much of the details below provided from the [facility]  hospital notes (care anywhere). She reports that she has had progressive difficulty walking, now requires someone to walk with in addition to a walker. She quit working in 1998 as she was unable to walk well. She has been told she has low heart rate for many years, in general has chronic fatigue, shortness of breath, worse with exertion. She does not feel particular asymptomatic from her bradycardia. She reports that she has had a Holter every year that has shown bradycardia. Heart rate seems to improve with exertion. Started back on her CPAP in September 2014 with a new machine. Seems to be tolerating this well. She feels better, no wheezing, less fatigued during the day. Her family members also have MD. No episodes of syncope. Does have occasional lightheadedness.  Problem List: 1. Shortness of breath, chest pains a. Stress MRI 04/14/2009: Normal LVEF. No ischemia or infarction. No scar or infiltrative disease. b. MRI 10/2012: EF 65%, no scar, mild AI. 2. Myotonic muscular dystrophy type 1, based on genetic testing, EMG, and exam ECG 3. Mixed hyperlipidemia / hypertriglyceridemia  4. Depression  5. Paget disease s/p retromastoid craniotomy 11/2009 of hyperostosis complicated by postoperative cereballar hematoma and left occipital lobe ischemic infaction, recurrent infarction 02/2010 6. Obstructive sleep apnea Titrated to BIPAP 25/20 cmH2O, started 06/2013  PAST SURGICAL HISTORY:  1. Endometriosis status post hysterectomy / ovarectomy 1993  2. Cataract laser surgery 2002  Social History: Patient is married, is disabled due to her muscular weakness. She denies regular intake of alcoholic beverages, does not smoke, follows a fat, sugar,  salt restricted diet. She is not able to exercise.   Family History: She is adopted and her family history is noncontributory. She does have three children. Two daughters who both are diagnosed with myotonic muscular dystrophy and a son who does not have muscular  dystrophy, suddenly during sleep at age 50 in August of this year.   Outpatient Encounter Prescriptions as of 10/18/2014  Medication Sig  . buPROPion (WELLBUTRIN XL) 150 MG 24 hr tablet Take 150 mg by mouth daily.  Marland Kitchen ezetimibe (ZETIA) 10 MG tablet Take 10 mg by mouth daily.  . fenofibrate 54 MG tablet Take 108 mg by mouth daily.  . NON FORMULARY CPAP  . pregabalin (LYRICA) 100 MG capsule Take 200 mg by mouth daily.   . QUEtiapine (SEROQUEL) 25 MG tablet Take 25 mg by mouth at bedtime.  . sertraline (ZOLOFT) 100 MG tablet Take 100 mg by mouth daily.  . traMADol (ULTRAM) 50 MG tablet Take by mouth every 6 (six) hours as needed.  . zolpidem (AMBIEN) 5 MG tablet Take 5 mg by mouth at bedtime as needed for sleep.  . [DISCONTINUED] fenofibrate 160 MG tablet Take 160 mg by mouth daily.    Review of Systems  Constitutional: Negative.   Respiratory: Negative.   Cardiovascular: Negative.   Gastrointestinal: Negative.   Musculoskeletal: Positive for myalgias and gait problem.       Generalized muscle weakness  Neurological: Negative.   Hematological: Negative.   Psychiatric/Behavioral: Negative.   All other systems reviewed and are negative.   BP 110/72 mmHg  Pulse 65  Ht 5' 7.5" (1.715 m)  Wt 193 lb 8 oz (87.771 kg)  BMI 29.84 kg/m2  Physical Exam  Constitutional: She is oriented to person, place, and time. She appears well-developed and well-nourished.  HENT:  Head: Normocephalic.  Nose: Nose normal.  Mouth/Throat: Oropharynx is clear and moist.  Eyes: Conjunctivae are normal. Pupils are equal, round, and reactive to light.  Neck: Normal range of motion. Neck supple. No JVD present.  Cardiovascular: Normal rate, regular rhythm, S1 normal, S2 normal, normal heart sounds and intact distal pulses.  Exam reveals no gallop and no friction rub.   No murmur heard. Pulmonary/Chest: Effort normal and breath sounds normal. No respiratory distress. She has no wheezes. She has no rales. She  exhibits no tenderness.  Abdominal: Soft. Bowel sounds are normal. She exhibits no distension. There is no tenderness.  Musculoskeletal: She exhibits no edema or tenderness.  Muscle weakness, diffuse  Lymphadenopathy:    She has no cervical adenopathy.  Neurological: She is alert and oriented to person, place, and time.  Skin: Skin is warm and dry. No rash noted. No erythema.  Psychiatric: She has a normal mood and affect. Her behavior is normal. Judgment and thought content normal.    Assessment and Plan  Nursing note and vitals reviewed.

## 2014-11-10 DIAGNOSIS — R0602 Shortness of breath: Secondary | ICD-10-CM | POA: Diagnosis not present

## 2014-11-10 DIAGNOSIS — G4733 Obstructive sleep apnea (adult) (pediatric): Secondary | ICD-10-CM | POA: Diagnosis not present

## 2014-11-10 DIAGNOSIS — R5381 Other malaise: Secondary | ICD-10-CM | POA: Diagnosis not present

## 2014-11-10 DIAGNOSIS — G7111 Myotonic muscular dystrophy: Secondary | ICD-10-CM | POA: Diagnosis not present

## 2014-11-10 DIAGNOSIS — G710 Muscular dystrophy: Secondary | ICD-10-CM | POA: Diagnosis not present

## 2014-11-13 DIAGNOSIS — G4733 Obstructive sleep apnea (adult) (pediatric): Secondary | ICD-10-CM | POA: Diagnosis not present

## 2014-11-15 ENCOUNTER — Ambulatory Visit: Payer: Self-pay | Admitting: Specialist

## 2014-11-15 DIAGNOSIS — R0602 Shortness of breath: Secondary | ICD-10-CM | POA: Diagnosis not present

## 2014-12-03 DIAGNOSIS — G7111 Myotonic muscular dystrophy: Secondary | ICD-10-CM | POA: Diagnosis not present

## 2014-12-03 DIAGNOSIS — G4733 Obstructive sleep apnea (adult) (pediatric): Secondary | ICD-10-CM | POA: Diagnosis not present

## 2014-12-03 DIAGNOSIS — R0602 Shortness of breath: Secondary | ICD-10-CM | POA: Diagnosis not present

## 2014-12-06 DIAGNOSIS — E782 Mixed hyperlipidemia: Secondary | ICD-10-CM | POA: Diagnosis not present

## 2014-12-06 DIAGNOSIS — R7309 Other abnormal glucose: Secondary | ICD-10-CM | POA: Diagnosis not present

## 2014-12-06 DIAGNOSIS — E6609 Other obesity due to excess calories: Secondary | ICD-10-CM | POA: Diagnosis not present

## 2014-12-06 DIAGNOSIS — E538 Deficiency of other specified B group vitamins: Secondary | ICD-10-CM | POA: Diagnosis not present

## 2014-12-12 DIAGNOSIS — G4733 Obstructive sleep apnea (adult) (pediatric): Secondary | ICD-10-CM | POA: Diagnosis not present

## 2014-12-13 DIAGNOSIS — E119 Type 2 diabetes mellitus without complications: Secondary | ICD-10-CM | POA: Diagnosis not present

## 2014-12-13 DIAGNOSIS — Z0000 Encounter for general adult medical examination without abnormal findings: Secondary | ICD-10-CM | POA: Diagnosis not present

## 2014-12-13 DIAGNOSIS — E782 Mixed hyperlipidemia: Secondary | ICD-10-CM | POA: Diagnosis not present

## 2014-12-17 ENCOUNTER — Ambulatory Visit: Payer: Self-pay | Admitting: Family Medicine

## 2014-12-17 DIAGNOSIS — Z1231 Encounter for screening mammogram for malignant neoplasm of breast: Secondary | ICD-10-CM | POA: Diagnosis not present

## 2014-12-21 DIAGNOSIS — F4310 Post-traumatic stress disorder, unspecified: Secondary | ICD-10-CM | POA: Diagnosis not present

## 2014-12-21 DIAGNOSIS — F3342 Major depressive disorder, recurrent, in full remission: Secondary | ICD-10-CM | POA: Diagnosis not present

## 2014-12-24 DIAGNOSIS — R0602 Shortness of breath: Secondary | ICD-10-CM | POA: Diagnosis not present

## 2014-12-28 DIAGNOSIS — G4733 Obstructive sleep apnea (adult) (pediatric): Secondary | ICD-10-CM | POA: Diagnosis not present

## 2014-12-28 DIAGNOSIS — R5383 Other fatigue: Secondary | ICD-10-CM | POA: Diagnosis not present

## 2014-12-28 DIAGNOSIS — H539 Unspecified visual disturbance: Secondary | ICD-10-CM | POA: Diagnosis not present

## 2014-12-28 DIAGNOSIS — G7111 Myotonic muscular dystrophy: Secondary | ICD-10-CM | POA: Diagnosis not present

## 2015-01-12 DIAGNOSIS — G4733 Obstructive sleep apnea (adult) (pediatric): Secondary | ICD-10-CM | POA: Diagnosis not present

## 2015-01-30 NOTE — Discharge Summary (Signed)
PATIENT NAME:  Rebekah Warner, CASASOLA MR#:  [id number] DATE OF BIRTH:  1951-03-07  DATE OF ADMISSION:  10/28/2011 DATE OF DISCHARGE:  11/03/2011  DISCHARGE DIAGNOSES:  1. Urinary tract infection causing dehydration and weakness.  2. Exacerbation of muscular dystrophy based on above.  3. Recent diarrheal illness with dehydration.  4. Severe depression.  5. All the above causing functional quadriplegia.   DISCHARGE MEDICATIONS:  1. Amoxicillin 500 mg t.i.d. for three more days for urinary tract infection.  2. Seroquel 25 mg at bedtime to help treat severe depression and insomnia.  3. Zoloft 100 mg daily.  4. Wellbutrin 150 mg at bedtime.  5. Zetia 10 mg daily.  6. Lyrica 100 mg b.i.d.  7. TriCor 145 mg daily.    HISTORY AND PHYSICAL: Please see detailed History and Physical done on admission.   HOSPITAL COURSE: The patient was admitted with nausea, vomiting, diarrhea, severe depression. Urinary culture was done and ultimately grew Escherichia coli in her urine,  did have some resistances, but ampicillin was sensitive. She tolerates amoxicillin well. She was given that. Nausea improved some. She was given IV fluids which did help as well and brought her hydration status to normal. Celebrex was held given the nausea and vomiting. Diarrhea did clear up, though she did have a negative C. difficile toxin ultimately. Ultrasound showed some possible fatty liver, no gallstones, etc. noted. She could ambulate in the room but that was it by the time of discharge. Home Health will follow up further with physical therapy, PT/OT, etc. She was able to do more on her own and again verbalized she was much less depressed prior to discharge.      TIME SPENT: It took approximately 35 minutes to do all discharge tasks.   ____________________________ Ocie Cornfield. Ouida Sills, MD [id number] D: 11/03/2011 11:39:47 ET T: 11/03/2011 12:15:09 ET JOB#: [id number]  cc: Ocie Cornfield. Ouida Sills, MD, <Dictator> Kirk Ruths  MD ELECTRONICALLY SIGNED 11/03/2011 12:38

## 2015-01-30 NOTE — H&P (Signed)
PATIENT NAME:  Rebekah Warner, Rebekah Warner MR#:  [id number] DATE OF BIRTH:  February 04, 1951  DATE OF ADMISSION:  10/28/2011  REFERRING PHYSICIAN: Dr. Cinda Quest    PRIMARY CARE PHYSICIAN: Dr. Ouida Sills at [facility]: Nausea, vomiting, diarrhea.   HISTORY OF PRESENT ILLNESS: The patient is a 64 year old Caucasian female with history of CVA, chronic muscular dystrophy, sleep apnea on CPAP, and Paget's disease who presents with diarrhea since Thursday night which is explosive, watery, and has persisted since then with nausea, vomiting, and p.o. intolerance starting today. She denies having any fevers, although has had some chills today. She has had multiple episodes of loose watery stools since the onset multiple times a day. There is no blood or pus in the stool. The nausea and vomiting started today and she has had three episodes of vomiting with yellowish vomitus, nonbloody, nonbilious, non-projectile. The patient has been unable to take any p.o. today except for some sips of water. She has had no sick contacts with similar symptoms although she has had two daughters with sore throat. She has had no recent antibiotics. She also has some epigastric abdominal pain that started today after her vomiting episodes. She has received IV fluid as well as Zofran x2, however, still feels nauseous. Hospitalist services were contacted for further evaluation and management.   PAST MEDICAL HISTORY:  1. History of cerebellar CVA. 2. History of dysphagia status post G-tube insertion after stroke. Currently it has been stopped for the past year and a half or so. 3. Muscular dystrophy. 4. Sleep apnea, on CPAP.  5. Paget's disease. 6. Dyslipidemia. 7. History of dysphagia. 8. History of hearing loss.  9. Status post retromastoid craniotomy for resection of hyperosteoarthritic bone with subsequent cerebral bleed and hematoma evacuation and CVA in the past.   ALLERGIES: No known drug allergies.   MEDICATIONS:   1. Zoloft 100 mg daily.  2. Zetia 10 mg daily.  3. Celebrex 100 mg daily.  4. Wellbutrin 150 mg extended-release daily.  5. Fenofibrate 145 mg daily.  6. Alprazolam 1 mg at bedtime as needed for anxiety and sleep.   SOCIAL HISTORY: Denies tobacco, alcohol, or drug use. Walks with a walker and wheelchair.   FAMILY HISTORY: She is adopted, however, she has daughters with muscular dystrophy.   REVIEW OF SYSTEMS: CONSTITUTIONAL: No fever but overall generalized weakness which is chronic. No weight changes. EYES: History of double vision but resolved. No glaucoma. ENT: Has hearing loss, moderate in nature. Has obstructive sleep apnea. No postnasal drip. RESPIRATORY: No cough. Has chronic shortness of breath. No chronic obstructive pulmonary disease. CARDIOVASCULAR: No chest pain or orthopnea. No edema or arrhythmia. GI: Nausea, vomiting, and diarrhea as above. Epigastric abdominal pain. No bloody stool. No dark stools. No constipation. GU: No dysuria or hematuria. No history of renal colic. ENDOCRINE: No polyuria, nocturia, or increased sweating. HEME/LYMPH: No anemia. She complains of easy bruising at times. No swollen glands. SKIN: No rashes. MUSCULOSKELETAL: There is chronic arthritis. NEUROLOGIC: Has chronic weakness in the extremities, history of CVA and TIA in the past. PSYCH: Has depression and anxiety.   PHYSICAL EXAMINATION:   VITAL SIGNS: On arrival, pulse 68, respiratory rate 18, blood pressure 128/80, oxygen sat 92% on room air.   GENERAL: The patient is a well developed pale Caucasian female laying in bed talking in full sentences.   HEENT: Normocephalic, atraumatic. Pupils are equal and reactive. Anicteric sclerae. Dry mucous membranes.   NECK: Supple. No thyroid tenderness.  CARDIOVASCULAR: S1, S2 regular rate and rhythm. No murmurs appreciated.   LUNGS: Clear to auscultation bilaterally. No wheezing or rales.   ABDOMEN: Soft, nondistended. Slight upper epigastric tenderness  to deep palpation. Slight left upper quadrant tenderness. No rebound or guarding. No suprapubic tenderness.   EXTREMITIES: No significant lower extremity edema.   NEUROLOGIC: Cranial nerves II through XII grossly intact. Strength 5/5 in all extremities.   LABORATORY, DIAGNOSTIC, AND RADIOLOGICAL DATA: Glucose 106, creatinine 0.75, BUN 16, sodium 143, potassium 3.8. Magnesium 1.6. Lipase 132. LFTs AST 54, ALT 55, total protein 8.4, bilirubin 0.8. WBC 3.8, hemoglobin 15.4, hematocrit 45.4. Influenza negative. Urinalysis 2+ blood, positive nitrates, trace leukocyte esterase, 3+ bacteria, 5 WBCs.   ASSESSMENT AND PLAN: We have a 64 year old Caucasian female with muscular dystrophy, hyperlipidemia, history of CVA, obstructive sleep apnea on CPAP, anxiety and depression who presents with diarrhea since Thursday with nausea, vomiting, and p.o. intolerance today. At this point, given that she still has nausea and p.o. intolerance with two doses of Zofran will admit the patient to the hospital. Would check stool cultures including Clostridium difficile. Will start the patient on Zofran and Phenergan as well as Protonix 40 mg IV twice daily. She denies having any sick contacts with similar symptoms, however, has had two daughters with sore throat. It is possible that she has a viral gastroenteritis. The patient also possibly had an abnormal ultrasound with some images on the ultrasound done on January 17th showing a right kidney stone 1 cm in the upper pole of the right kidney. She also has some blood in the urinalysis. I would get a CT scan of the abdomen and pelvis to rule out nephrolithiasis which could potentially cause the nausea and vomiting, however, would not cause the diarrhea. Will start the patient on gentle IV fluids as well. The gastric pain started today after her nausea and vomiting and is likely in the setting of heaving. Again, we would start the PPI b.i.d. For her sleep apnea, we would start her on  her outpatient CPAP. Would continue her other medications while the patient is hospitalized.  CODE STATUS: FULL CODE.       TOTAL TIME SPENT: 45 minutes.   ____________________________ Vivien Presto, MD sa:drc D: 10/28/2011 21:27:20 ET T: 10/29/2011 09:51:28 ET JOB#: [id number]  cc: Vivien Presto, MD, <Dictator> Mitchell Ouida Sills, MD Vivien Presto MD ELECTRONICALLY SIGNED 11/06/2011 18:41

## 2015-02-09 DIAGNOSIS — G4733 Obstructive sleep apnea (adult) (pediatric): Secondary | ICD-10-CM | POA: Diagnosis not present

## 2015-02-11 DIAGNOSIS — F330 Major depressive disorder, recurrent, mild: Secondary | ICD-10-CM | POA: Insufficient documentation

## 2015-02-11 DIAGNOSIS — F3342 Major depressive disorder, recurrent, in full remission: Secondary | ICD-10-CM | POA: Insufficient documentation

## 2015-02-11 DIAGNOSIS — G4733 Obstructive sleep apnea (adult) (pediatric): Secondary | ICD-10-CM | POA: Diagnosis not present

## 2015-02-11 DIAGNOSIS — F4310 Post-traumatic stress disorder, unspecified: Secondary | ICD-10-CM | POA: Insufficient documentation

## 2015-02-17 DIAGNOSIS — I69193 Ataxia following nontraumatic intracerebral hemorrhage: Secondary | ICD-10-CM | POA: Diagnosis not present

## 2015-02-17 DIAGNOSIS — E119 Type 2 diabetes mellitus without complications: Secondary | ICD-10-CM | POA: Diagnosis not present

## 2015-02-17 DIAGNOSIS — G7111 Myotonic muscular dystrophy: Secondary | ICD-10-CM | POA: Diagnosis not present

## 2015-02-17 DIAGNOSIS — G4733 Obstructive sleep apnea (adult) (pediatric): Secondary | ICD-10-CM | POA: Diagnosis not present

## 2015-03-01 DIAGNOSIS — Z01419 Encounter for gynecological examination (general) (routine) without abnormal findings: Secondary | ICD-10-CM | POA: Diagnosis not present

## 2015-03-01 DIAGNOSIS — N8320 Unspecified ovarian cysts: Secondary | ICD-10-CM | POA: Diagnosis not present

## 2015-03-01 DIAGNOSIS — Z124 Encounter for screening for malignant neoplasm of cervix: Secondary | ICD-10-CM | POA: Diagnosis not present

## 2015-03-15 DIAGNOSIS — E119 Type 2 diabetes mellitus without complications: Secondary | ICD-10-CM | POA: Diagnosis not present

## 2015-03-15 DIAGNOSIS — E782 Mixed hyperlipidemia: Secondary | ICD-10-CM | POA: Diagnosis not present

## 2015-03-21 DIAGNOSIS — E119 Type 2 diabetes mellitus without complications: Secondary | ICD-10-CM | POA: Diagnosis not present

## 2015-03-21 DIAGNOSIS — G7111 Myotonic muscular dystrophy: Secondary | ICD-10-CM | POA: Diagnosis not present

## 2015-03-21 DIAGNOSIS — H9313 Tinnitus, bilateral: Secondary | ICD-10-CM | POA: Diagnosis not present

## 2015-03-21 DIAGNOSIS — E782 Mixed hyperlipidemia: Secondary | ICD-10-CM | POA: Diagnosis not present

## 2015-03-23 DIAGNOSIS — L821 Other seborrheic keratosis: Secondary | ICD-10-CM | POA: Diagnosis not present

## 2015-03-23 DIAGNOSIS — L989 Disorder of the skin and subcutaneous tissue, unspecified: Secondary | ICD-10-CM | POA: Diagnosis not present

## 2015-04-05 DIAGNOSIS — H903 Sensorineural hearing loss, bilateral: Secondary | ICD-10-CM | POA: Diagnosis not present

## 2015-04-05 DIAGNOSIS — H9313 Tinnitus, bilateral: Secondary | ICD-10-CM | POA: Diagnosis not present

## 2015-04-13 DIAGNOSIS — G4733 Obstructive sleep apnea (adult) (pediatric): Secondary | ICD-10-CM | POA: Diagnosis not present

## 2015-04-13 DIAGNOSIS — G7111 Myotonic muscular dystrophy: Secondary | ICD-10-CM | POA: Diagnosis not present

## 2015-04-13 DIAGNOSIS — H539 Unspecified visual disturbance: Secondary | ICD-10-CM | POA: Diagnosis not present

## 2015-04-13 DIAGNOSIS — R5383 Other fatigue: Secondary | ICD-10-CM | POA: Diagnosis not present

## 2015-06-20 DIAGNOSIS — G4733 Obstructive sleep apnea (adult) (pediatric): Secondary | ICD-10-CM | POA: Diagnosis not present

## 2015-06-23 ENCOUNTER — Ambulatory Visit (INDEPENDENT_AMBULATORY_CARE_PROVIDER_SITE_OTHER): Payer: Commercial Managed Care - HMO | Admitting: Psychiatry

## 2015-06-23 ENCOUNTER — Encounter: Payer: Self-pay | Admitting: Psychiatry

## 2015-06-23 VITALS — BP 122/68 | HR 82 | Temp 97.3°F | Ht 67.5 in | Wt 178.4 lb

## 2015-06-23 DIAGNOSIS — F4310 Post-traumatic stress disorder, unspecified: Secondary | ICD-10-CM | POA: Diagnosis not present

## 2015-06-23 DIAGNOSIS — F341 Dysthymic disorder: Secondary | ICD-10-CM

## 2015-06-23 MED ORDER — BUPROPION HCL ER (SR) 150 MG PO TB12: 150.0000 mg | ORAL_TABLET | Freq: Every morning | ORAL | Status: DC

## 2015-06-23 MED ORDER — QUETIAPINE FUMARATE 25 MG PO TABS: 25.0000 mg | ORAL_TABLET | Freq: Every day | ORAL | Status: DC

## 2015-06-23 MED ORDER — SERTRALINE HCL 100 MG PO TABS: 100.0000 mg | ORAL_TABLET | Freq: Every day | ORAL | Status: DC

## 2015-06-23 MED ORDER — ZOLPIDEM TARTRATE 5 MG PO TABS: 5.0000 mg | ORAL_TABLET | Freq: Every evening | ORAL | Status: DC | PRN

## 2015-06-23 NOTE — Progress Notes (Signed)
[facility] MD Progress Note  06/23/2015 2:27 PM Rebekah Warner  MRN:  [id number] Subjective:  Follow-up for this 64 year old woman with chronic depression and anxiety partially related to depression partially related to some posttraumatic anxiety. Patient has no new complaints. Mood has been feeling pretty good for the last few months. Still has periods of crying about her son about twice a month but the rest of the time feels okay. Sleeping well. No suicidal thoughts. Good medicine tolerance. Getting along well with her husband. She has lost a little bit of weight and feels physically good. Sleeping pretty well. Principal Problem: @PPROB @ Diagnosis:   Patient Active Problem List   Diagnosis Date Noted  . Neurosis, posttraumatic [F43.10] 02/11/2015  . Depression, major, recurrent, mild [F33.0] 02/11/2015  . Depression, major, recurrent, in complete remission [F33.42] 02/11/2015  . Diabetes type 2, controlled [E11.9] 10/18/2014  . Hyperlipidemia [E78.5] 11/02/2013  . Muscular dystrophy, myotonic [G71.11] 11/02/2013  . CVA (cerebral vascular accident) [I63.9] 11/02/2013  . Bradycardia [R00.1] 10/16/2013  . First degree atrioventricular block [I44.0] 09/25/2013  . Cardiac arrhythmia [I49.9] 10/30/2012  . Depressive disorder [F32.9] 10/10/2012  . Transient ischemic attack (TIA), and cerebral infarction without residual deficits [I63.9, G45.9] 10/10/2012  . Irritable colon [K58.9] 10/10/2012  . Obstructive sleep apnea [G47.33] 10/10/2012  . Osteitis deformans [M88.9] 10/10/2012  . Shortness of breath [R06.02] 10/10/2012  . Myotonic muscular dystrophy [G71.11] 09/19/2012   Total Time spent with patient: 30 minutes   Past Medical History:  Past Medical History  Diagnosis Date  . Personal history of Paget's disease of bone   . Sinus bradycardia   . Myotonic dystrophy, type 1   . Dyslipidemia   . OSA (obstructive sleep apnea)   . History of CVA (cerebrovascular accident)   . Depression   . IBS  (irritable bowel syndrome)   . First degree AV block   . Major depressive disorder, recurrent episode, mild   . Major depressive disorder, recurrent episode, in full remission   . Posttraumatic stress disorder   . Recurrent major depression in full remission     Past Surgical History  Procedure Laterality Date  . Retinal detachment surgery    . Total abdominal hysterectomy    . Cataract extraction    . Colonoscopy     Family History:  Family History  Problem Relation Age of Onset  . Adopted: Yes  . Family history unknown: Yes   Social History:  History  Alcohol Use No     History  Drug Use No    Social History   Social History  . Marital Status: Married    Spouse Name: N/A  . Number of Children: N/A  . Years of Education: N/A   Social History Main Topics  . Smoking status: Never Smoker   . Smokeless tobacco: Never Used  . Alcohol Use: No  . Drug Use: No  . Sexual Activity: No   Other Topics Concern  . None   Social History Narrative   Additional History:    Sleep: Fair  Appetite:  Good   Assessment: patient appears to be doing well. Stable. No new complaints. Still with multiple medical problems. No change to baseline level of mood except possible improvement. No side effects to medicine.  Musculoskeletal: Strength & Muscle Tone: decreased Gait & Station: unable to stand Patient leans: N/A   Psychiatric Specialty Exam: Physical Exam  ROS  Blood pressure 122/68, pulse 82, temperature 97.3 F (36.3 C), temperature source Tympanic, height  5' 7.5" (1.715 m), weight 178 lb 6.4 oz (80.922 kg), SpO2 82 %.Body mass index is 27.51 kg/(m^2).  General Appearance: Fairly Groomed  Engineer, water::  Good  Speech:  Normal Rate  Volume:  Normal  Mood:  Euthymic  Affect:  Congruent  Thought Process:  Intact  Orientation:  Full (Time, Place, and Person)  Thought Content:  Negative  Suicidal Thoughts:  No  Homicidal Thoughts:  No  Memory:  Immediate;    Good Recent;   Fair Remote;   Fair  Judgement:  Intact  Insight:  Present  Psychomotor Activity:  Normal  Concentration:  Good  Recall:  Good  Fund of Knowledge:Good  Language: Good  Akathisia:  No  Handed:  Right  AIMS (if indicated):     Assets:  Communication Skills Desire for Improvement Financial Resources/Insurance Housing Intimacy Resilience Social Support  ADL's:  Intact  Cognition: WNL  Sleep:        Current Medications: Current Outpatient Prescriptions  Medication Sig Dispense Refill  . buPROPion (WELLBUTRIN XL) 150 MG 24 hr tablet Take 150 mg by mouth daily.    . fenofibrate 54 MG tablet Take 108 mg by mouth daily.    . NON FORMULARY CPAP    . QUEtiapine (SEROQUEL) 25 MG tablet Take 1 tablet (25 mg total) by mouth at bedtime. 90 tablet 1  . sertraline (ZOLOFT) 100 MG tablet Take 1 tablet (100 mg total) by mouth daily. 90 tablet 1  . traMADol (ULTRAM) 50 MG tablet Take by mouth every 6 (six) hours as needed.    . zolpidem (AMBIEN) 5 MG tablet Take 1 tablet (5 mg total) by mouth at bedtime as needed for sleep. 90 tablet 1  . buPROPion (WELLBUTRIN SR) 150 MG 12 hr tablet Take 1 tablet (150 mg total) by mouth every morning. 90 tablet 1  . ezetimibe (ZETIA) 10 MG tablet Take 10 mg by mouth daily.    . pregabalin (LYRICA) 100 MG capsule Take 200 mg by mouth daily.      No current facility-administered medications for this visit.    Lab Results: No results found for this or any previous visit (from the past 48 hour(s)).  Physical Findings: AIMS:  , ,  ,  ,    CIWA:    COWS:     Treatment Plan Summary: Medication management and Plan supportive counseling. Reviewed symptoms of depression. Encourage patient to continue staying physically and socially active. Reviewed medication. She discontinued Lyrica and is taking gabapentin now but feels it works just as well. Renew the Wellbutrin. Renew low dose of Seroquel and Zoloft and Ambien. Follow-up 6  months.   Medical Decision Making:  Established Problem, Stable/Improving (1), Review of Psycho-Social Stressors (1), Review of Last Therapy Session (1) and Review of Medication Regimen & Side Effects (2)     Kimberley Speece 06/23/2015, 2:27 PM

## 2015-06-24 DIAGNOSIS — R102 Pelvic and perineal pain: Secondary | ICD-10-CM | POA: Diagnosis not present

## 2015-06-27 DIAGNOSIS — R1031 Right lower quadrant pain: Secondary | ICD-10-CM | POA: Insufficient documentation

## 2015-06-27 DIAGNOSIS — R102 Pelvic and perineal pain: Secondary | ICD-10-CM | POA: Insufficient documentation

## 2015-06-27 DIAGNOSIS — N949 Unspecified condition associated with female genital organs and menstrual cycle: Secondary | ICD-10-CM | POA: Diagnosis not present

## 2015-07-20 ENCOUNTER — Inpatient Hospital Stay: Payer: Commercial Managed Care - HMO | Attending: Obstetrics and Gynecology | Admitting: Obstetrics and Gynecology

## 2015-07-20 VITALS — BP 128/79 | HR 76 | Temp 98.2°F | Ht 67.0 in | Wt 178.0 lb

## 2015-07-20 DIAGNOSIS — N838 Other noninflammatory disorders of ovary, fallopian tube and broad ligament: Secondary | ICD-10-CM | POA: Insufficient documentation

## 2015-07-20 DIAGNOSIS — N839 Noninflammatory disorder of ovary, fallopian tube and broad ligament, unspecified: Secondary | ICD-10-CM

## 2015-07-20 NOTE — Progress Notes (Signed)
Assisted physician with pelvic exam. 

## 2015-07-20 NOTE — Progress Notes (Signed)
Gynecologic Oncology Consult Visit   Referring Provider: Glenis Smoker, CNM, [facility]   Chief Concern: right adnexal mass and pain  Subjective:  Rebekah Warner is a 64 y.o. female who is seen in consultation from Dr. Leafy Ro and Suszanne Conners at [facility].  She underwent TAH/BSO in 1993 for endometriosis by Dr Barnett Applebaum on [facility].  Had endometrioma in right ovary and additional lesions and was on hormonal therapy for several years.  No hormones now.    Has been followed by Dr Ouida Sills at [facility] for several years and has had a known small cystic mass in the right pelvis.  It was 5.05 cm 06/27/15 and had been 4.81 cm 03/01/15. There is some concern that there may be a change in the character of the mass with some solid areas instead of just fluid.  CA125 17.4 06/29/15 and was 8.9 one year ago.  She has significant medical issues including surgery at [facility] about 6 years ago for Paget's disease in the skull.  Post op course was complicated by bleed and CVA and she was in a coma for 6 months with feeding tube.  No residual major cognitive or motor defects related to this episode.  She also has muscular dystropy and gets around in a scooter.   ROS significant for multiple complaints including weight loss, weakness, ringing in ears, hearing loss, SOB, wheezing, diarrhea, constipation, incomplete emptying of bladder, pain in extremities, back and joints.    Problem List: Patient Active Problem List   Diagnosis Date Noted  . Ovarian mass, right 07/20/2015  . Neurosis, posttraumatic 02/11/2015  . Depression, major, recurrent, mild ([place]) 02/11/2015  . Depression, major, recurrent, in complete remission ([place]) 02/11/2015  . Diabetes type 2, controlled ([place]) 10/18/2014  . Acute cerebellar ataxia ([place]) 09/29/2014  . Insomnia, persistent 08/11/2014  . Diabetes mellitus, type 2 ([place]) 08/11/2014  . Fatigue 04/26/2014  . Adiposity 04/26/2014  . Vision disturbance 04/26/2014  .  Hyperlipidemia 11/02/2013  . Muscular dystrophy, myotonic ([place]) 11/02/2013  . CVA (cerebral vascular accident) ([place]) 11/02/2013  . Cerebral artery occlusion with cerebral infarction ([place]) 11/02/2013  . Bradycardia 10/16/2013  . First degree atrioventricular block 09/25/2013  . 1St degree AV block 09/25/2013  . Cardiac arrhythmia 10/30/2012  . Bradycardia, sinus 10/30/2012  . Depressive disorder 10/10/2012  . Transient ischemic attack (TIA), and cerebral infarction without residual deficits 10/10/2012  . Irritable colon 10/10/2012  . Obstructive sleep apnea 10/10/2012  . Osteitis deformans 10/10/2012  . Shortness of breath 10/10/2012  . Clinical depression 10/10/2012  . Cerebrovascular accident, old 10/10/2012  . Dyslipidemia 10/10/2012  . Obstructive apnea 10/10/2012  . Myotonic muscular dystrophy ([place]) 09/19/2012  . 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 Personal history of Paget's disease of bone   . Sinus bradycardia   . Myotonic dystrophy, type 1   . Dyslipidemia   . OSA (obstructive sleep apnea)   . History of CVA (cerebrovascular accident)   . Depression   . IBS (irritable bowel syndrome)   . First degree AV block   . Major depressive disorder, recurrent episode, mild   . Major depressive disorder, recurrent episode, in full remission   . Posttraumatic stress disorder   . Recurrent major depression in full remission     Past Surgical History: Past Surgical History  Procedure Laterality Date  . Retinal detachment surgery    . Total abdominal hysterectomy    .  Cataract extraction    . Colonoscopy      OB History:  OB History  No data available    Family History: Family History  Problem Relation Age of Onset  . Adopted: Yes  . Family history unknown: Yes    Social History: Social History   Social History  . Marital Status: Married    Spouse Name: N/A  . Number of  Children: N/A  . Years of Education: N/A   Occupational History  . Not on file.   Social History Main Topics  . Smoking status: Never Smoker   . Smokeless tobacco: Never Used  . Alcohol Use: No  . Drug Use: No  . Sexual Activity: No   Other Topics Concern  . Not on file   Social History Narrative    Allergies: Allergies  Allergen Reactions  . Actos  [Pioglitazone] Diarrhea  . Metformin Nausea Only    Current Medications: Current Outpatient Prescriptions  Medication Sig Dispense Refill  . albuterol (PROAIR HFA) 108 (90 BASE) MCG/ACT inhaler Inhale into the lungs.    . Alcohol Swabs (B-D SINGLE USE SWABS REGULAR) PADS     . Blood Glucose Monitoring Suppl (ONE TOUCH ULTRA SYSTEM KIT) W/DEVICE KIT Use as directed. Dx: E11.9    . buPROPion (WELLBUTRIN SR) 150 MG 12 hr tablet Take 1 tablet (150 mg total) by mouth every morning. 90 tablet 1  . buPROPion (WELLBUTRIN XL) 150 MG 24 hr tablet Take 150 mg by mouth daily.    . fenofibrate 54 MG tablet Take 108 mg by mouth daily.    . NON FORMULARY CPAP    . QUEtiapine (SEROQUEL) 25 MG tablet Take 1 tablet (25 mg total) by mouth at bedtime. 90 tablet 1  . sertraline (ZOLOFT) 100 MG tablet Take 1 tablet (100 mg total) by mouth daily. 90 tablet 1  . traMADol (ULTRAM) 50 MG tablet Take by mouth every 6 (six) hours as needed.    . zolpidem (AMBIEN) 5 MG tablet Take 1 tablet (5 mg total) by mouth at bedtime as needed for sleep. 90 tablet 1  . gabapentin (NEURONTIN) 300 MG capsule Take by mouth.     No current facility-administered medications for this visit.    Review of Systems See HPI  Objective:  Physical Examination:  Blood pressure 128/79, pulse 76, temperature 98.2 F (36.8 C), temperature source Oral, height '5\' 7"'  (1.702 m), weight 178 lb (80.74 kg).  ECOG Performance Status: 2 - Symptomatic, <50% confined to bed  General appearance: alert, cooperative and appears stated age HEENT:PERRLA, neck supple with midline trachea  and thyroid without masses. [place]. Lymph node survey: non-palpable, axillary, inguinal, supraclavicular Cardiovascular: regular rate and rhythm, no murmurs or gallops Respiratory: normal air entry, lungs clear to auscultation and no rales, rhonchi or wheezing Breast exam: not examined. Abdomen: soft, non-tender, without masses or organomegaly, no masses palpated, no hernias and well healed incision Back: inspection of back is normal Extremities: extremities normal, atraumatic, no cyanosis or edema Skin exam - normal coloration and turgor, no rashes, no suspicious skin lesions noted. Neurological exam reveals alert, oriented, normal speech, no focal findings or movement disorder noted.  Pelvic: exam chaperoned by nurse;  Vulva: normal appearing vulva with no masses, tenderness or lesions; Vagina: normal vagina;  Bimanual/RV: no masses palpable, mild tenderness in RLQ.    Assessment:  Rebekah Warner is a 64 y.o. female s/p TAH/BSO for severe endometriosis in 1993.  Now has 5 cm right pelvic mass  that has been followed over time and has enlarged slightly, and there is now concern on [place] that the mass has developed solid areas.  She had some acute pain recently and this has mostly improved.  The right pelvic mass could represent residual endometriosis or ovary, and there is some risk for malignant transformation in endometriosis.  Although her CA125 is normal, it has doubled in the past year.     She has multiple significant medical problems that including depression, prior CVA, muscular dystrophy, Diabetes, heart arrhythmia, Paget's disease of bone.    Plan:   Problem List Items Addressed This Visit      Other   Ovarian mass, right - Primary     We discussed options for management including surgery, however, she is at significant risk for complications due to medical comorbidities.  In view of this we will obtain an MRI to further characterize the mass.  If she continues to have pain and/or  if the MRI is concerning we will proceed with resection of the mass.  This could be attempted laparoscopically, but there would be a high chance of conversion to laparotomy.  In view of her medical issues it may be best to do the surgery at [facility].  Patient will have MRI and RTC to see me in 3 weeks.  If pain increasing before then can come to see Dr Theora Gianotti in the next two weeks.     Mellody Drown, MD  CC:  Dion Body, MD [facility] [place], [place] [address] [phone]

## 2015-07-27 NOTE — Progress Notes (Signed)
Chart opened for order entry.

## 2015-07-27 NOTE — Addendum Note (Signed)
Addended by: Roselind Messier T on: 07/27/2015 09:49 AM   Modules accepted: Orders

## 2015-07-28 NOTE — Addendum Note (Signed)
Addended by: Roselind Messier T on: 07/28/2015 10:01 AM   Modules accepted: Orders

## 2015-07-28 NOTE — Progress Notes (Signed)
Opened encounter for order entry.

## 2015-08-08 ENCOUNTER — Ambulatory Visit
Admission: RE | Admit: 2015-08-08 | Discharge: 2015-08-08 | Disposition: A | Discharge status: Home / Self Care | Payer: Commercial Managed Care - HMO | Source: Ambulatory Visit | Attending: Obstetrics and Gynecology | Admitting: Obstetrics and Gynecology

## 2015-08-08 DIAGNOSIS — R1900 Intra-abdominal and pelvic swelling, mass and lump, unspecified site: Secondary | ICD-10-CM | POA: Insufficient documentation

## 2015-08-08 DIAGNOSIS — N8189 Other female genital prolapse: Secondary | ICD-10-CM | POA: Insufficient documentation

## 2015-08-08 DIAGNOSIS — Z90710 Acquired absence of both cervix and uterus: Secondary | ICD-10-CM | POA: Insufficient documentation

## 2015-08-08 DIAGNOSIS — N838 Other noninflammatory disorders of ovary, fallopian tube and broad ligament: Secondary | ICD-10-CM

## 2015-08-08 DIAGNOSIS — R1909 Other intra-abdominal and pelvic swelling, mass and lump: Secondary | ICD-10-CM | POA: Diagnosis not present

## 2015-08-08 DIAGNOSIS — R102 Pelvic and perineal pain: Secondary | ICD-10-CM | POA: Diagnosis present

## 2015-08-08 MED ORDER — GADOBENATE DIMEGLUMINE 529 MG/ML IV SOLN
20.0000 mL | Freq: Once | INTRAVENOUS | Status: AC | PRN
  Administered 2015-08-08: 16 mL via INTRAVENOUS

## 2015-08-22 DIAGNOSIS — G4736 Sleep related hypoventilation in conditions classified elsewhere: Secondary | ICD-10-CM | POA: Diagnosis not present

## 2015-08-24 ENCOUNTER — Inpatient Hospital Stay: Payer: Commercial Managed Care - HMO | Attending: Obstetrics and Gynecology | Admitting: Obstetrics and Gynecology

## 2015-08-24 ENCOUNTER — Inpatient Hospital Stay: Payer: Commercial Managed Care - HMO

## 2015-08-24 VITALS — BP 139/85 | HR 63 | Temp 98.0°F | Ht 67.5 in | Wt 178.0 lb

## 2015-08-24 DIAGNOSIS — Z90710 Acquired absence of both cervix and uterus: Secondary | ICD-10-CM

## 2015-08-24 DIAGNOSIS — N838 Other noninflammatory disorders of ovary, fallopian tube and broad ligament: Secondary | ICD-10-CM

## 2015-08-24 DIAGNOSIS — Z79899 Other long term (current) drug therapy: Secondary | ICD-10-CM

## 2015-08-24 DIAGNOSIS — F3340 Major depressive disorder, recurrent, in remission, unspecified: Secondary | ICD-10-CM | POA: Diagnosis not present

## 2015-08-24 DIAGNOSIS — G7111 Myotonic muscular dystrophy: Secondary | ICD-10-CM

## 2015-08-24 DIAGNOSIS — D3910 Neoplasm of uncertain behavior of unspecified ovary: Secondary | ICD-10-CM

## 2015-08-24 DIAGNOSIS — I499 Cardiac arrhythmia, unspecified: Secondary | ICD-10-CM

## 2015-08-24 DIAGNOSIS — E119 Type 2 diabetes mellitus without complications: Secondary | ICD-10-CM

## 2015-08-24 NOTE — Progress Notes (Signed)
Gynecologic Oncology Consult Visit   Referring Provider: Glenis Smoker, CNM, [facility]   Chief Concern: right adnexal mass and pain  Subjective:  Rebekah Warner is a 64 y.o. female who is seen in consultation from Dr. Leafy Ro and Suszanne Conners at [facility].    ROS significant for multiple complaints including weight loss, weakness, ringing in ears, hearing loss, SOB, wheezing, diarrhea, constipation, incomplete emptying of bladder, pain in extremities, back and joints.    MRI was done  10/31 and was reassuring.  See below.  MRI FINDINGS: Normal pelvic bowel loops. No significant free fluid. No pelvic adenopathy. Hysterectomy. Left ovary not visualized. The likely arising from the inferior aspect of the right ovary is a lesion which measures 3.8 x 4.0 cm on image/series 14/3. Compare 6.5 x 4.5 cm on the 10/28/2011 CT. This demonstrates primarily T2 hyperintensity with diffuse T1 hyperintensity prior to contrast. No post-contrast enhancement.  Pelvic floor laxity is mild to moderate. No acute osseous Abnormality.  IMPRESSION: 1. Right pelvic Mass is favored to arise off the inferior aspect of the right ovary. New complexity within since 2013 is favored to represent hemorrhage into a residual ovarian follicle or cyst. Less likely differential consideration includes endometrioma. Size regression and absence of enhancing solid components are consistent with a benign etiology. 2. No other explanation for pain. 3. Pelvic floor laxity.  Medical History She underwent TAH/BSO in 1993 for endometriosis by Dr Barnett Applebaum on [facility].  Had endometrioma in right ovary and additional lesions and was on hormonal therapy for several years.  No hormones now.    Has been followed by Dr Ouida Sills at [facility] for several years and has had a known small cystic mass in the right pelvis.  It was 5.05 cm 06/27/15 and had been 4.81 cm 03/01/15. There is some concern that there may be a change in  the character of the mass with some solid areas instead of just fluid.  CA125 17.4 06/29/15 and was 8.9 one year ago.  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  Post op course was complicated by bleed and CVA and she was in a coma for 6 months with feeding tube.  No residual major cognitive or motor defects related to this episode.  She also has muscular dystropy and gets around in a scooter.    Problem List: Patient Active Problem List   Diagnosis Date Noted  . Ovarian mass, right 07/20/2015  . Neurosis, posttraumatic 02/11/2015  . Depression, major, recurrent, mild ([place]) 02/11/2015  . Depression, major, recurrent, in complete remission ([place]) 02/11/2015  . Diabetes type 2, controlled ([place]) 10/18/2014  . Acute cerebellar ataxia ([place]) 09/29/2014  . Insomnia, persistent 08/11/2014  . Diabetes mellitus, type 2 ([place]) 08/11/2014  . Fatigue 04/26/2014  . Adiposity 04/26/2014  . Vision disturbance 04/26/2014  . Hyperlipidemia 11/02/2013  . Muscular dystrophy, myotonic ([place]) 11/02/2013  . CVA (cerebral vascular accident) ([place]) 11/02/2013  . Cerebral artery occlusion with cerebral infarction ([place]) 11/02/2013  . Bradycardia 10/16/2013  . First degree atrioventricular block 09/25/2013  . 1St degree AV block 09/25/2013  . Cardiac arrhythmia 10/30/2012  . Bradycardia, sinus 10/30/2012  . Depressive disorder 10/10/2012  . Transient ischemic attack (TIA), and cerebral infarction without residual deficits 10/10/2012  . Irritable colon 10/10/2012  . Obstructive sleep apnea 10/10/2012  . Osteitis deformans 10/10/2012  . Shortness of breath 10/10/2012  . Clinical depression 10/10/2012  .  Cerebrovascular accident, old 10/10/2012  . Dyslipidemia 10/10/2012  . Obstructive apnea 10/10/2012  . Myotonic muscular dystrophy ([place]) 09/19/2012  . 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 Personal history of Paget's disease of bone   . Sinus bradycardia   . Myotonic dystrophy, type 1   . Dyslipidemia   . OSA (obstructive sleep apnea)   . History of CVA (cerebrovascular accident)   . Depression   . IBS (irritable bowel syndrome)   . First degree AV block   . Major depressive disorder, recurrent episode, mild   . Major depressive disorder, recurrent episode, in full remission   . Posttraumatic stress disorder   . Recurrent major depression in full remission     Past Surgical History: Past Surgical History  Procedure Laterality Date  . Retinal detachment surgery    . Total abdominal hysterectomy    . Cataract extraction    . Colonoscopy      OB History:  OB History  No data available    Family History: Family History  Problem Relation Age of Onset  . Adopted: Yes  . Family history unknown: Yes    Social History: Social History   Social History  . Marital Status: Married    Spouse Name: N/A  . Number of Children: N/A  . Years of Education: N/A   Occupational History  . Not on file.   Social History Main Topics  . Smoking status: Never Smoker   . Smokeless tobacco: Never Used  . Alcohol Use: No  . Drug Use: No  . Sexual Activity: No   Other Topics Concern  . Not on file   Social History Narrative    Allergies: Allergies  Allergen Reactions  . Actos  [Pioglitazone] Diarrhea  . Metformin Nausea Only    Current Medications: Current Outpatient Prescriptions  Medication Sig Dispense Refill  . albuterol (PROAIR HFA) 108 (90 BASE) MCG/ACT inhaler Inhale into the lungs.    . Alcohol Swabs (B-D SINGLE USE SWABS REGULAR) PADS     . Blood Glucose Monitoring Suppl (ONE TOUCH ULTRA SYSTEM KIT) W/DEVICE KIT Use as directed. Dx: E11.9    . buPROPion (WELLBUTRIN SR) 150 MG 12 hr tablet Take 1 tablet (150 mg total) by mouth every morning. 90 tablet 1  . buPROPion (WELLBUTRIN XL) 150 MG 24 hr tablet Take 150 mg  by mouth daily.    . fenofibrate 54 MG tablet Take 108 mg by mouth daily.    Marland Kitchen gabapentin (NEURONTIN) 300 MG capsule Take by mouth.    . NON FORMULARY CPAP    . QUEtiapine (SEROQUEL) 25 MG tablet Take 1 tablet (25 mg total) by mouth at bedtime. 90 tablet 1  . sertraline (ZOLOFT) 100 MG tablet Take 1 tablet (100 mg total) by mouth daily. 90 tablet 1  . traMADol (ULTRAM) 50 MG tablet Take by mouth every 6 (six) hours as needed.    . zolpidem (AMBIEN) 5 MG tablet Take 1 tablet (5 mg total) by mouth at bedtime as needed for sleep. 90 tablet 1   No current facility-administered medications for this visit.    Review of Systems See HPI  Objective:  Physical Examination:  Blood pressure 128/79, pulse 76, temperature 98.2 F (36.8 C), temperature source Oral, height _0  (1.702 m), weight 178 lb (80.74 kg).  ECOG Performance Status: 2 - Symptomatic, <50% confined to bed  General appearance: alert, cooperative and appears stated  age HEENT:PERRLA, neck supple with midline trachea and thyroid without masses. [place]. Lymph node survey: non-palpable, axillary, inguinal, supraclavicular Cardiovascular: regular rate and rhythm, no murmurs or gallops Respiratory: normal air entry, lungs clear to auscultation and no rales, rhonchi or wheezing Breast exam: not examined. Abdomen: soft, non-tender, without masses or organomegaly, no masses palpated, no hernias and well healed incision Back: inspection of back is normal Extremities: extremities normal, atraumatic, no cyanosis or edema Skin exam - normal coloration and turgor, no rashes, no suspicious skin lesions noted. Neurological exam reveals alert, oriented, normal speech, no focal findings or movement disorder noted.  Pelvic:  Not done    Assessment:  AKEELA BUSK is a 64 y.o. female s/p TAH/BSO for severe endometriosis in 1993.  She has right pelvic mass that has been followed over time and had enlarged slightly, and there was concern on [place]  that the mass has developed solid areas.  She had some acute pain recently and this has mostly improved.  The right pelvic mass could represent residual endometriosis or residual ovary, and there is some risk for malignant transformation in endometriosis.  Although her CA125 is normal, it has doubled in the past year from 8 to 33.     MRI was done for further characterization and mass looked smaller and there is no concern for features suggestive of cancer.   She has multiple significant medical problems that including depression, prior CVA, muscular dystrophy, Diabetes, heart arrhythmia, Paget's disease of bone.    Plan:   Problem List Items Addressed This Visit      Other   Ovarian mass, right - Primary     We discussed options for management including surgery, however, she is at significant risk for complications due to medical comorbidities.  At this point she has only occasional pain and does not want surgery.   She will follow up with Gyn at [facility] or sooner if pain is worse.  We can see her back prn.    Mellody Drown, MD  CC:  Dion Body, MD [place] [facility] [place], [place] [address] [phone]

## 2015-09-15 DIAGNOSIS — E782 Mixed hyperlipidemia: Secondary | ICD-10-CM | POA: Diagnosis not present

## 2015-09-22 DIAGNOSIS — R7303 Prediabetes: Secondary | ICD-10-CM | POA: Diagnosis not present

## 2015-09-22 DIAGNOSIS — G4733 Obstructive sleep apnea (adult) (pediatric): Secondary | ICD-10-CM | POA: Diagnosis not present

## 2015-09-22 DIAGNOSIS — F3340 Major depressive disorder, recurrent, in remission, unspecified: Secondary | ICD-10-CM | POA: Diagnosis not present

## 2015-09-22 DIAGNOSIS — E782 Mixed hyperlipidemia: Secondary | ICD-10-CM | POA: Diagnosis not present

## 2015-11-08 ENCOUNTER — Ambulatory Visit: Payer: Commercial Managed Care - HMO | Admitting: Physician Assistant

## 2015-11-16 ENCOUNTER — Encounter: Payer: Self-pay | Admitting: Cardiovascular Disease

## 2015-11-16 ENCOUNTER — Ambulatory Visit (INDEPENDENT_AMBULATORY_CARE_PROVIDER_SITE_OTHER): Payer: Commercial Managed Care - HMO | Admitting: Cardiovascular Disease

## 2015-11-16 VITALS — BP 100/68 | HR 68 | Ht 67.5 in | Wt 178.0 lb

## 2015-11-16 DIAGNOSIS — I499 Cardiac arrhythmia, unspecified: Secondary | ICD-10-CM

## 2015-11-16 DIAGNOSIS — I440 Atrioventricular block, first degree: Secondary | ICD-10-CM

## 2015-11-16 DIAGNOSIS — E119 Type 2 diabetes mellitus without complications: Secondary | ICD-10-CM

## 2015-11-16 DIAGNOSIS — R001 Bradycardia, unspecified: Secondary | ICD-10-CM | POA: Diagnosis not present

## 2015-11-16 DIAGNOSIS — R0602 Shortness of breath: Secondary | ICD-10-CM

## 2015-11-16 NOTE — Progress Notes (Signed)
Patient ID: Rebekah Warner, female    DOB: [birth year], 64 y.o.   MRN: [id number]  HPI Comments:  Rebekah Warner is an 65 y.o. 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  She presents for routine follow-up of her shortness of breath symptoms  Prior history of Paget's disease with complications and prolonged hospital course. Hyperlipidemia, CVA after a fall while in the rehabilitation. Her son died unexpectedly in sleep in 06/10/13 She had cognitive impairment after stroke, but has made a strong recovery   presents today in a scooter  she reports that she has lost weight ,  Over the course of the past year has had a continued steady decline in her leg strength as well as arm strength  Denies having significant problems with shortness of breath, no chest pain, no significant leg edema  lab work reviewed with her  Showing total cholesterol has increased from 150 up to 200 , hemoglobin A1c decreased from 7.2 down to 5.7  Prior echocardiogram showing ejection fraction 50%,  Done through the [place] system   reports she was without tramadol for 2 weeks, had to lay in bed with severe pain. Refill provided by neurology, potter   EKG on today's visit shows normal sinus rhythm with rate 68 bpm, nonspecific T wave abnormality   Other past medical history reviewed unable to walk without assistance. uses a lift and she uses a motorized scooter to get out of the house.  She has muscle pains in upper arms, stiffness and pain in right shoulder, neck., hands hurt at night. She fell few times since the last visit, walks in leaves with a hole, stumbled and fell, she bruised her knees. If she falls she cannot get up on her own, she cannot bend over.  Swallowing more difficult, feels like propulsion is very slow, chokes easily during sleep and coughs then a lot, likely  phlegm aspiration, no respiratory infections.  Previously seen at [facility]. Much of the details below provided from the [facility] hospital notes (care anywhere). She reports that she has had progressive difficulty walking, now requires someone to walk with in addition to a walker. She quit working in 1998 as she was unable to walk well. She has been told she has low heart rate for many years, in general has chronic fatigue, shortness of breath, worse with exertion. She does not feel particular asymptomatic from her bradycardia. She reports that she has had a Holter every year that has shown bradycardia. Heart rate seems to improve with exertion. Started back on her CPAP in September 2014 with a new machine. Seems to be tolerating this well. She feels better, no wheezing, less fatigued during the day. Her family members also have MD. No episodes of syncope. Does have occasional lightheadedness.  Problem List: 1. Shortness of breath, chest pains a. Stress MRI 04/14/2009: Normal LVEF. No ischemia or infarction. No scar or infiltrative disease. b. MRI 10/2012: EF 65%, no scar, mild AI. 2. Myotonic muscular dystrophy type 1, based on genetic testing, EMG, and exam ECG 3. Mixed hyperlipidemia / hypertriglyceridemia  4. Depression  5. Paget disease s/p retromastoid craniotomy 11/2009 of hyperostosis complicated by postoperative cereballar hematoma and left occipital lobe ischemic infaction, recurrent infarction 02/2010 6. Obstructive sleep apnea Titrated to BIPAP 25/20 cmH2O, started 06/2013    Allergies  Allergen Reactions  . Actos  [  Pioglitazone] Diarrhea  . Metformin Nausea Only    Current Outpatient Prescriptions on File Prior to Visit  Medication Sig Dispense Refill  . albuterol (PROAIR HFA) 108 (90 BASE) MCG/ACT inhaler Inhale into the lungs.    . Alcohol Swabs (B-D SINGLE USE SWABS REGULAR) PADS     . Blood Glucose Monitoring Suppl (ONE TOUCH ULTRA SYSTEM KIT) W/DEVICE KIT Use as directed.  Dx: E11.9    . buPROPion (WELLBUTRIN SR) 150 MG 12 hr tablet Take 1 tablet (150 mg total) by mouth every morning. 90 tablet 1  . gabapentin (NEURONTIN) 300 MG capsule Take 300 mg by mouth 3 (three) times daily.     . NON FORMULARY CPAP    . sertraline (ZOLOFT) 100 MG tablet Take 1 tablet (100 mg total) by mouth daily. 90 tablet 1   No current facility-administered medications on file prior to visit.    Past Medical History  Diagnosis Date  . Personal history of Paget's disease of bone   . Sinus bradycardia   . Myotonic dystrophy, type 1 ([place])   . Dyslipidemia   . OSA (obstructive sleep apnea)   . History of CVA (cerebrovascular accident)   . Depression   . IBS (irritable bowel syndrome)   . First degree AV block   . [name], recurrent episode, mild ([place])   . [name], recurrent episode, in full remission ([place])   . Posttraumatic stress disorder   . [name] [facility])     Past Surgical History  Procedure Laterality Date  . Retinal detachment surgery    . Total abdominal hysterectomy    . Cataract extraction    . Colonoscopy      Social History  reports that she has never smoked. She has never used smokeless tobacco. She reports that she does not drink alcohol or use illicit drugs.  Family History She was adopted. Family history is unknown by patient.   Review of Systems  Constitutional: Negative.   Respiratory: Negative.   Cardiovascular: Negative.   Gastrointestinal: Negative.   Musculoskeletal: Positive for myalgias and gait problem.       Generalized muscle weakness  Neurological: Negative.   Hematological: Negative.   Psychiatric/Behavioral: Negative.   All other systems reviewed and are negative.   BP 100/68 mmHg  Pulse 68  Ht 5' 7.5" (1.715 m)  Wt 178 lb (80.74 kg)  BMI 27.45 kg/m2  Physical Exam  Constitutional: She is oriented to person, place, and time. She appears well-developed and  well-nourished.  HENT:  Head: Normocephalic.  Nose: Nose normal.  Mouth/Throat: Oropharynx is clear and moist.  Eyes: Conjunctivae are normal. Pupils are equal, round, and reactive to light.  Neck: Normal range of motion. Neck supple. No JVD present.  Cardiovascular: Normal rate, regular rhythm, S1 normal, S2 normal, normal heart sounds and intact distal pulses.  Exam reveals no gallop and no friction rub.   No murmur heard. Pulmonary/Chest: Effort normal and breath sounds normal. No respiratory distress. She has no wheezes. She has no rales. She exhibits no tenderness.  Abdominal: Soft. Bowel sounds are normal. She exhibits no distension. There is no tenderness.  Musculoskeletal: She exhibits no edema or tenderness.  Muscle weakness, diffuse  Lymphadenopathy:    She has no cervical adenopathy.  Neurological: She is alert and oriented to person, place, and time.  Skin: Skin is warm and dry. No rash noted. No erythema.  Psychiatric: She has a normal mood and affect.  Her behavior is normal. Judgment and thought content normal.    Assessment and Plan  Nursing note and vitals reviewed.

## 2015-11-16 NOTE — Assessment & Plan Note (Signed)
Heart rate well controlled,  Bradycardia not a active issue

## 2015-11-16 NOTE — Patient Instructions (Signed)
You are doing well. No medication changes were made.  Please call us if you have new issues that need to be addressed before your next appt.  Your physician wants you to follow-up in: 12 months.  You will receive a reminder letter in the mail two months in advance. If you don't receive a letter, please call our office to schedule the follow-up appointment. 

## 2015-11-16 NOTE — Assessment & Plan Note (Signed)
Chronic baseline shortness of breath likely secondary to muscle weakness, denies any significant progression

## 2015-11-16 NOTE — Assessment & Plan Note (Signed)
Encouraged her to watch her diet,  Low carbohydrates.  Unable to exercise on a regular basis

## 2015-12-20 ENCOUNTER — Ambulatory Visit (INDEPENDENT_AMBULATORY_CARE_PROVIDER_SITE_OTHER): Payer: Commercial Managed Care - HMO | Admitting: Psychiatry

## 2015-12-20 ENCOUNTER — Encounter: Payer: Self-pay | Admitting: Psychiatry

## 2015-12-20 VITALS — BP 108/68 | HR 70 | Temp 97.8°F | Ht 67.5 in | Wt 181.2 lb

## 2015-12-20 DIAGNOSIS — F341 Dysthymic disorder: Secondary | ICD-10-CM | POA: Diagnosis not present

## 2015-12-20 DIAGNOSIS — F4310 Post-traumatic stress disorder, unspecified: Secondary | ICD-10-CM

## 2015-12-20 MED ORDER — BUPROPION HCL ER (SR) 150 MG PO TB12: 150.0000 mg | ORAL_TABLET | Freq: Every morning | ORAL | Status: DC

## 2015-12-20 MED ORDER — SERTRALINE HCL 100 MG PO TABS: 100.0000 mg | ORAL_TABLET | Freq: Every day | ORAL | Status: DC

## 2015-12-20 MED ORDER — ZOLPIDEM TARTRATE 10 MG PO TABS: 10.0000 mg | ORAL_TABLET | Freq: Every evening | ORAL | Status: DC | PRN

## 2015-12-20 NOTE — Progress Notes (Signed)
[facility] MD Progress Note  12/20/2015 7:17 PM Rebekah Warner  MRN:  [id number] Subjective:  Follow-up for 65 year old woman with PTSD and anxiety. Patient reports she is actually doing quite well. Her daughter and her daughter's boyfriend moved out of the house leaving her and her husband alone which is been a great thing. She is feeling good. Mood is good. Energy seems to be improved. Sleeping well. Principal Problem: @PPROB @ Diagnosis:   Patient Active Problem List   Diagnosis Date Noted  . Ovarian mass, right [N83.9] 07/20/2015  . Adnexal pain [R10.2] 06/27/2015  . Abdominal pain, right lower quadrant [R10.31] 06/27/2015  . Neurosis, posttraumatic [F43.10] 02/11/2015  . Depression, major, recurrent, mild ([place]) [F33.0] 02/11/2015  . Depression, major, recurrent, in complete remission ([place]) [F33.42] 02/11/2015  . Diabetes type 2, controlled ([place]) [E11.9] 10/18/2014  . Acute cerebellar ataxia ([place]) [G11.9] 09/29/2014  . Cerebellar ataxia ([place]) [G11.9] 09/29/2014  . Insomnia, persistent [G47.00] 08/11/2014  . Diabetes mellitus, type 2 ([place]) [E11.9] 08/11/2014  . Borderline diabetes mellitus [R73.03] 08/11/2014  . Fatigue [R53.83] 04/26/2014  . Adiposity [E66.9] 04/26/2014  . Vision disturbance [H53.9] 04/26/2014  . Unspecified visual disturbance [H53.9] 04/26/2014  . Hyperlipidemia [E78.5] 11/02/2013  . Muscular dystrophy, myotonic ([place]) [G71.11] 11/02/2013  . CVA (cerebral vascular accident) ([place]) [I63.9] 11/02/2013  . Cerebral artery occlusion with cerebral infarction ([place]) [I63.50] 11/02/2013  . Bradycardia [R00.1] 10/16/2013  . First degree atrioventricular block [I44.0] 09/25/2013  . 1St degree AV block [I44.0] 09/25/2013  . Cardiac arrhythmia [I49.9] 10/30/2012  . Bradycardia, sinus [R00.1] 10/30/2012  . Depressive disorder [F32.9] 10/10/2012  . Transient ischemic attack (TIA), and cerebral infarction without residual deficits [I63.9, G45.9] 10/10/2012  . Irritable colon [K58.9]  10/10/2012  . Obstructive sleep apnea [G47.33] 10/10/2012  . Osteitis deformans [M88.9] 10/10/2012  . Shortness of breath [R06.02] 10/10/2012  . Clinical depression [F32.9] 10/10/2012  . Cerebrovascular accident, old [Z86.73] 10/10/2012  . Dyslipidemia [E78.5] 10/10/2012  . Obstructive apnea [G47.33] 10/10/2012  . Recurrent major depression in remission ([place]) [F33.40] 10/10/2012  . Myotonic muscular dystrophy ([place]) [G71.11] 09/19/2012  . Curschmann-Batten-Steinert syndrome ([place]) [G71.11] 09/19/2012  . Myotonic dystrophy ([place]) [G71.11] 09/19/2012   Total Time spent with patient: 20 minutes  Past Psychiatric History: Past history of anxiety and depression no suicide attempts  Past Medical History:  Past Medical History  Diagnosis Date  . Personal history of Paget's disease of bone   . Sinus bradycardia   . Myotonic dystrophy, type 1 ([place])   . Dyslipidemia   . OSA (obstructive sleep apnea)   . History of CVA (cerebrovascular accident)   . Depression   . IBS (irritable bowel syndrome)   . First degree AV block   . Major depressive disorder, recurrent episode, mild ([place])   . Major depressive disorder, recurrent episode, in full remission ([place])   . Posttraumatic stress disorder   . Recurrent major depression in full remission [facility])     Past Surgical History  Procedure Laterality Date  . Retinal detachment surgery    . Total abdominal hysterectomy    . Cataract extraction    . Colonoscopy     Family History:  Family History  Problem Relation Age of Onset  . Adopted: Yes  . Family history unknown: Yes   Family Psychiatric  History: Positive for anxiety Social History:  History  Alcohol Use No     History  Drug Use No    Social History   Social History  . Marital Status:  Married    Spouse Name: N/A  . Number of Children: N/A  . Years of Education: N/A   Social History Main Topics  . Smoking status: Never Smoker   . Smokeless tobacco: Never Used  . Alcohol Use:  No  . Drug Use: No  . Sexual Activity: No   Other Topics Concern  . None   Social History Narrative   Additional Social History:                         Sleep: Good  Appetite:  Good  Current Medications: Current Outpatient Prescriptions  Medication Sig Dispense Refill  . Alcohol Swabs (B-D SINGLE USE SWABS REGULAR) PADS     . buPROPion (WELLBUTRIN SR) 150 MG 12 hr tablet Take 1 tablet (150 mg total) by mouth every morning. 90 tablet 1  . fenofibrate 160 MG tablet Take by mouth.    . NON FORMULARY CPAP    . pregabalin (LYRICA) 300 MG capsule Take 300 mg by mouth 3 (three) times daily.    . QUEtiapine (SEROQUEL) 100 MG tablet Take 100 mg by mouth at bedtime.     . sertraline (ZOLOFT) 100 MG tablet Take 1 tablet (100 mg total) by mouth daily. 90 tablet 1  . traMADol (ULTRAM) 50 MG tablet     . zolpidem (AMBIEN) 10 MG tablet Take 1 tablet (10 mg total) by mouth at bedtime as needed for sleep. 90 tablet 1  . albuterol (PROAIR HFA) 108 (90 BASE) MCG/ACT inhaler Inhale into the lungs.    . gabapentin (NEURONTIN) 300 MG capsule Take 300 mg by mouth 3 (three) times daily.     . traMADol (ULTRAM-ER) 100 MG 24 hr tablet Take 100 mg by mouth daily as needed for pain. Reported on 12/20/2015     No current facility-administered medications for this visit.    Lab Results: No results found for this or any previous visit (from the past 48 hour(s)).  Blood Alcohol level:  No results found for: [facility]  Physical Findings: AIMS:  , ,  ,  ,    CIWA:    COWS:     Musculoskeletal: Strength & Muscle Tone: decreased Gait & Station: unsteady Patient leans: N/A  Psychiatric Specialty Exam: ROS  Blood pressure 108/68, pulse 70, temperature 97.8 F (36.6 C), temperature source Tympanic, height 5' 7.5" (1.715 m), weight 181 lb 3.2 oz (82.192 kg), SpO2 88 %.Body mass index is 27.94 kg/(m^2).  General Appearance: Fairly Groomed  Engineer, water::  Good  Speech:  Normal Rate  Volume:  Normal   Mood:  Euthymic  Affect:  Appropriate  Thought Process:  Coherent  Orientation:  Full (Time, Place, and Person)  Thought Content:  Negative  Suicidal Thoughts:  No  Homicidal Thoughts:  No  Memory:  Immediate;   Good Recent;   Good Remote;   Good  Judgement:  Good  Insight:  Good  Psychomotor Activity:  Decreased  Concentration:  Fair  Recall:  Warner of Knowledge:Good  Language: Good  Akathisia:  No  Handed:  Right  AIMS (if indicated):     Assets:  Communication Skills Desire for Improvement Financial Resources/Insurance Housing Resilience Social Support  ADL's:  Intact  Cognition: WNL  Sleep:      Treatment Plan Summary: Medication management and Plan No change to medicine. Supportive counseling completed. She really does seem to be doing better. Energy good. Follow-up in another 6 months. She agrees  to the plan.  Alethia Berthold, MD 12/20/2015, 7:17 PM

## 2016-01-14 ENCOUNTER — Other Ambulatory Visit: Payer: Self-pay | Admitting: Psychiatry

## 2016-02-21 ENCOUNTER — Other Ambulatory Visit: Payer: Self-pay | Admitting: Obstetrics and Gynecology

## 2016-02-21 DIAGNOSIS — Z1231 Encounter for screening mammogram for malignant neoplasm of breast: Secondary | ICD-10-CM

## 2016-02-21 DIAGNOSIS — N83201 Unspecified ovarian cyst, right side: Secondary | ICD-10-CM | POA: Diagnosis not present

## 2016-02-22 DIAGNOSIS — G4733 Obstructive sleep apnea (adult) (pediatric): Secondary | ICD-10-CM | POA: Diagnosis not present

## 2016-03-13 ENCOUNTER — Ambulatory Visit
Admission: RE | Admit: 2016-03-13 | Discharge: 2016-03-13 | Disposition: A | Discharge status: Home / Self Care | Payer: Commercial Managed Care - HMO | Source: Ambulatory Visit | Attending: Obstetrics and Gynecology | Admitting: Obstetrics and Gynecology

## 2016-03-13 DIAGNOSIS — Z1231 Encounter for screening mammogram for malignant neoplasm of breast: Secondary | ICD-10-CM | POA: Diagnosis not present

## 2016-03-15 DIAGNOSIS — R7303 Prediabetes: Secondary | ICD-10-CM | POA: Diagnosis not present

## 2016-03-15 DIAGNOSIS — E782 Mixed hyperlipidemia: Secondary | ICD-10-CM | POA: Diagnosis not present

## 2016-03-15 DIAGNOSIS — R7309 Other abnormal glucose: Secondary | ICD-10-CM | POA: Diagnosis not present

## 2016-03-22 DIAGNOSIS — F3340 Major depressive disorder, recurrent, in remission, unspecified: Secondary | ICD-10-CM | POA: Diagnosis not present

## 2016-03-22 DIAGNOSIS — Z7189 Other specified counseling: Secondary | ICD-10-CM | POA: Insufficient documentation

## 2016-03-22 DIAGNOSIS — Z7185 Encounter for immunization safety counseling: Secondary | ICD-10-CM | POA: Insufficient documentation

## 2016-03-22 DIAGNOSIS — Z0000 Encounter for general adult medical examination without abnormal findings: Secondary | ICD-10-CM | POA: Diagnosis not present

## 2016-03-22 DIAGNOSIS — E782 Mixed hyperlipidemia: Secondary | ICD-10-CM | POA: Diagnosis not present

## 2016-03-22 DIAGNOSIS — E119 Type 2 diabetes mellitus without complications: Secondary | ICD-10-CM | POA: Diagnosis not present

## 2016-04-30 DIAGNOSIS — F329 Major depressive disorder, single episode, unspecified: Secondary | ICD-10-CM | POA: Diagnosis not present

## 2016-04-30 DIAGNOSIS — G7111 Myotonic muscular dystrophy: Secondary | ICD-10-CM | POA: Diagnosis not present

## 2016-04-30 DIAGNOSIS — G4733 Obstructive sleep apnea (adult) (pediatric): Secondary | ICD-10-CM | POA: Diagnosis not present

## 2016-05-09 ENCOUNTER — Ambulatory Visit (INDEPENDENT_AMBULATORY_CARE_PROVIDER_SITE_OTHER): Payer: Commercial Managed Care - HMO | Admitting: Licensed Clinical Social Worker

## 2016-05-09 ENCOUNTER — Encounter: Payer: Self-pay | Admitting: Licensed Clinical Social Worker

## 2016-05-09 DIAGNOSIS — H35371 Puckering of macula, right eye: Secondary | ICD-10-CM | POA: Diagnosis not present

## 2016-05-09 DIAGNOSIS — F332 Major depressive disorder, recurrent severe without psychotic features: Secondary | ICD-10-CM | POA: Diagnosis not present

## 2016-05-09 DIAGNOSIS — H04123 Dry eye syndrome of bilateral lacrimal glands: Secondary | ICD-10-CM | POA: Diagnosis not present

## 2016-05-09 DIAGNOSIS — E119 Type 2 diabetes mellitus without complications: Secondary | ICD-10-CM | POA: Diagnosis not present
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
## 2016-05-23 ENCOUNTER — Ambulatory Visit (INDEPENDENT_AMBULATORY_CARE_PROVIDER_SITE_OTHER): Payer: Commercial Managed Care - HMO | Admitting: Licensed Clinical Social Worker

## 2016-05-23 DIAGNOSIS — F4310 Post-traumatic stress disorder, unspecified: Secondary | ICD-10-CM

## 2016-05-23 DIAGNOSIS — F332 Major depressive disorder, recurrent severe without psychotic features: Secondary | ICD-10-CM | POA: Diagnosis not present
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
## 2016-06-14 ENCOUNTER — Ambulatory Visit: Payer: Commercial Managed Care - HMO

## 2016-06-18 ENCOUNTER — Ambulatory Visit: Payer: Commercial Managed Care - HMO

## 2016-06-19 ENCOUNTER — Ambulatory Visit: Payer: Commercial Managed Care - HMO | Admitting: Psychiatry

## 2016-06-21 ENCOUNTER — Ambulatory Visit: Payer: Commercial Managed Care - HMO | Admitting: Psychiatry

## 2016-06-25 ENCOUNTER — Other Ambulatory Visit: Payer: Self-pay | Admitting: Psychiatry

## 2016-06-28 ENCOUNTER — Other Ambulatory Visit: Payer: Self-pay | Admitting: Psychiatry

## 2016-06-28 ENCOUNTER — Ambulatory Visit: Payer: Commercial Managed Care - HMO | Admitting: Licensed Clinical Social Worker

## 2016-07-05 ENCOUNTER — Ambulatory Visit (INDEPENDENT_AMBULATORY_CARE_PROVIDER_SITE_OTHER): Payer: Commercial Managed Care - HMO | Admitting: Psychiatry

## 2016-07-05 ENCOUNTER — Encounter: Payer: Self-pay | Admitting: Psychiatry

## 2016-07-05 VITALS — BP 104/71 | HR 74 | Temp 97.2°F

## 2016-07-05 DIAGNOSIS — F332 Major depressive disorder, recurrent severe without psychotic features: Secondary | ICD-10-CM | POA: Diagnosis not present

## 2016-07-05 DIAGNOSIS — F341 Dysthymic disorder: Secondary | ICD-10-CM

## 2016-07-05 DIAGNOSIS — F4310 Post-traumatic stress disorder, unspecified: Secondary | ICD-10-CM | POA: Diagnosis not present

## 2016-07-05 MED ORDER — SERTRALINE HCL 100 MG PO TABS: 100.0000 mg | ORAL_TABLET | Freq: Every day | ORAL | 1 refills | Status: DC

## 2016-07-05 MED ORDER — ZOLPIDEM TARTRATE 10 MG PO TABS: 10.0000 mg | ORAL_TABLET | Freq: Every evening | ORAL | 1 refills | Status: DC | PRN

## 2016-07-05 MED ORDER — QUETIAPINE FUMARATE 100 MG PO TABS
100.0000 mg | ORAL_TABLET | Freq: Every day | ORAL | 1 refills | Status: DC
Start: 2016-07-05 — End: 2016-11-15

## 2016-07-05 MED ORDER — BUPROPION HCL ER (SR) 150 MG PO TB12: 150.0000 mg | ORAL_TABLET | Freq: Every morning | ORAL | 1 refills | Status: DC

## 2016-07-05 NOTE — Progress Notes (Signed)
Follow-up for patient with chronic depression and anxiety. She reports that she's had a stressful summer. Husband has had medical problems. She continues to be weak and limited in her activity. Psychiatrically however she is stable. Denies depression or suicidal ideation. Tolerating medicine fine.  Neatly dressed woman. Awake alert and oriented. Speech normal rate tone and volume. Affect euthymic. Denies suicidal ideation.  No change to treatment plan. Refill medication. Follow-up 6 months.

## 2016-08-03 DIAGNOSIS — E782 Mixed hyperlipidemia: Secondary | ICD-10-CM | POA: Diagnosis not present

## 2016-08-03 DIAGNOSIS — E119 Type 2 diabetes mellitus without complications: Secondary | ICD-10-CM | POA: Diagnosis not present

## 2016-08-03 DIAGNOSIS — Z0000 Encounter for general adult medical examination without abnormal findings: Secondary | ICD-10-CM | POA: Diagnosis not present

## 2016-08-10 DIAGNOSIS — E119 Type 2 diabetes mellitus without complications: Secondary | ICD-10-CM | POA: Diagnosis not present

## 2016-08-10 DIAGNOSIS — Z23 Encounter for immunization: Secondary | ICD-10-CM | POA: Diagnosis not present

## 2016-08-10 DIAGNOSIS — E782 Mixed hyperlipidemia: Secondary | ICD-10-CM | POA: Diagnosis not present

## 2016-08-16 DIAGNOSIS — G4733 Obstructive sleep apnea (adult) (pediatric): Secondary | ICD-10-CM | POA: Diagnosis not present

## 2016-08-28 DIAGNOSIS — G4733 Obstructive sleep apnea (adult) (pediatric): Secondary | ICD-10-CM | POA: Diagnosis not present

## 2016-11-05 DIAGNOSIS — G4733 Obstructive sleep apnea (adult) (pediatric): Secondary | ICD-10-CM | POA: Diagnosis not present

## 2016-11-05 DIAGNOSIS — G7111 Myotonic muscular dystrophy: Secondary | ICD-10-CM | POA: Diagnosis not present

## 2016-11-05 DIAGNOSIS — F329 Major depressive disorder, single episode, unspecified: Secondary | ICD-10-CM | POA: Diagnosis not present

## 2016-11-07 ENCOUNTER — Telehealth: Payer: Self-pay

## 2016-11-07 NOTE — Telephone Encounter (Signed)
l MOM for pt to schedule f/u per Dr. Melrose Nakayama @ [facility]. Pt is Dr. Rockey Warner pt

## 2016-11-08 DIAGNOSIS — K589 Irritable bowel syndrome without diarrhea: Secondary | ICD-10-CM | POA: Diagnosis not present

## 2016-11-08 DIAGNOSIS — M1991 Primary osteoarthritis, unspecified site: Secondary | ICD-10-CM | POA: Diagnosis not present

## 2016-11-08 DIAGNOSIS — Z9181 History of falling: Secondary | ICD-10-CM | POA: Diagnosis not present

## 2016-11-08 DIAGNOSIS — E785 Hyperlipidemia, unspecified: Secondary | ICD-10-CM | POA: Diagnosis not present

## 2016-11-08 DIAGNOSIS — G7111 Myotonic muscular dystrophy: Secondary | ICD-10-CM | POA: Diagnosis not present

## 2016-11-08 DIAGNOSIS — G4733 Obstructive sleep apnea (adult) (pediatric): Secondary | ICD-10-CM | POA: Diagnosis not present

## 2016-11-08 DIAGNOSIS — M889 Osteitis deformans of unspecified bone: Secondary | ICD-10-CM | POA: Diagnosis not present

## 2016-11-08 DIAGNOSIS — E119 Type 2 diabetes mellitus without complications: Secondary | ICD-10-CM | POA: Diagnosis not present

## 2016-11-08 DIAGNOSIS — F329 Major depressive disorder, single episode, unspecified: Secondary | ICD-10-CM | POA: Diagnosis not present

## 2016-11-14 DIAGNOSIS — E785 Hyperlipidemia, unspecified: Secondary | ICD-10-CM | POA: Diagnosis not present

## 2016-11-14 DIAGNOSIS — M1991 Primary osteoarthritis, unspecified site: Secondary | ICD-10-CM | POA: Diagnosis not present

## 2016-11-14 DIAGNOSIS — Z9181 History of falling: Secondary | ICD-10-CM | POA: Diagnosis not present

## 2016-11-14 DIAGNOSIS — G4733 Obstructive sleep apnea (adult) (pediatric): Secondary | ICD-10-CM | POA: Diagnosis not present

## 2016-11-14 DIAGNOSIS — E119 Type 2 diabetes mellitus without complications: Secondary | ICD-10-CM | POA: Diagnosis not present

## 2016-11-14 DIAGNOSIS — K589 Irritable bowel syndrome without diarrhea: Secondary | ICD-10-CM | POA: Diagnosis not present

## 2016-11-14 DIAGNOSIS — M889 Osteitis deformans of unspecified bone: Secondary | ICD-10-CM | POA: Diagnosis not present

## 2016-11-14 DIAGNOSIS — G7111 Myotonic muscular dystrophy: Secondary | ICD-10-CM | POA: Diagnosis not present

## 2016-11-14 DIAGNOSIS — F329 Major depressive disorder, single episode, unspecified: Secondary | ICD-10-CM | POA: Diagnosis not present

## 2016-11-15 ENCOUNTER — Encounter: Payer: Self-pay | Admitting: Psychiatry

## 2016-11-15 ENCOUNTER — Ambulatory Visit (INDEPENDENT_AMBULATORY_CARE_PROVIDER_SITE_OTHER): Payer: Medicare HMO | Admitting: Psychiatry

## 2016-11-15 VITALS — BP 88/61 | HR 75 | Wt 171.2 lb

## 2016-11-15 DIAGNOSIS — F341 Dysthymic disorder: Secondary | ICD-10-CM

## 2016-11-15 DIAGNOSIS — F332 Major depressive disorder, recurrent severe without psychotic features: Secondary | ICD-10-CM

## 2016-11-15 DIAGNOSIS — F4310 Post-traumatic stress disorder, unspecified: Secondary | ICD-10-CM

## 2016-11-15 MED ORDER — BUPROPION HCL ER (SR) 150 MG PO TB12: 150.0000 mg | ORAL_TABLET | Freq: Every morning | ORAL | 1 refills | Status: DC

## 2016-11-15 MED ORDER — SERTRALINE HCL 100 MG PO TABS: 100.0000 mg | ORAL_TABLET | Freq: Every day | ORAL | 1 refills | Status: DC

## 2016-11-15 MED ORDER — QUETIAPINE FUMARATE 100 MG PO TABS: 100.0000 mg | ORAL_TABLET | Freq: Every day | ORAL | 1 refills | Status: DC

## 2016-11-15 MED ORDER — ZOLPIDEM TARTRATE 5 MG PO TABS: 5.0000 mg | ORAL_TABLET | Freq: Every evening | ORAL | 1 refills | Status: DC | PRN

## 2016-11-15 NOTE — Progress Notes (Signed)
Follow-up for this 66 year old woman with chronic anxiety and dysthymia. No new complaints. Tolerating medicine well. Mood is stable. No return of serious depression no suicidal ideation.  Casually dressed neatly groomed. Good eye contact. Normal psychomotor activity. Speech normal. Affect slightly blunted normal for her. Thoughts lucid no sign of delusions no suicidal or homicidal thought.  Review of medication usage. No sign of any side effects or misuse. Renew medicine and follow-up in another 6 months.

## 2016-11-16 ENCOUNTER — Encounter: Payer: Self-pay | Admitting: Cardiovascular Disease

## 2016-11-16 ENCOUNTER — Ambulatory Visit (INDEPENDENT_AMBULATORY_CARE_PROVIDER_SITE_OTHER): Payer: Medicare HMO | Admitting: Cardiovascular Disease

## 2016-11-16 VITALS — BP 120/72 | HR 71 | Ht 67.5 in | Wt 172.0 lb

## 2016-11-16 DIAGNOSIS — R1319 Other dysphagia: Secondary | ICD-10-CM

## 2016-11-16 DIAGNOSIS — R1310 Dysphagia, unspecified: Secondary | ICD-10-CM

## 2016-11-16 DIAGNOSIS — E785 Hyperlipidemia, unspecified: Secondary | ICD-10-CM

## 2016-11-16 DIAGNOSIS — E119 Type 2 diabetes mellitus without complications: Secondary | ICD-10-CM | POA: Diagnosis not present

## 2016-11-16 DIAGNOSIS — Z8673 Personal history of transient ischemic attack (TIA), and cerebral infarction without residual deficits: Secondary | ICD-10-CM

## 2016-11-16 DIAGNOSIS — R0602 Shortness of breath: Secondary | ICD-10-CM

## 2016-11-16 DIAGNOSIS — R001 Bradycardia, unspecified: Secondary | ICD-10-CM

## 2016-11-16 NOTE — Progress Notes (Signed)
Cardiology Office Note  Date:  11/16/2016   ID:  Rebekah Warner, Rebekah Warner 1951/08/09, MRN [id number]  PCP:  Dion Body, MD   Chief Complaint  Patient presents with  . other    23mo f/u. Pt states she is doing well, other than having frequent falls. Reviewed meds with pt verbally.    HPI:  Rebekah Warner is an 66 y.o. 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  She presents for routine follow-up of her shortness of breath symptoms  Prior history of Paget's disease with complications and prolonged hospital course. Hyperlipidemia, CVA after a fall while in the rehabilitation. Her son died unexpectedly in sleep in 2013/06/29 She had cognitive impairment after stroke, but has made a strong recovery  In follow-up today she reports having weight loss, difficulty swallowing Review of weight shows she is down 6 pounds Significant GI issues in the past several months  Food sticks, poor motility of food,  Once the food sticks in her esophagus, she is unable to eat any more or drink  She ends up throws up water after food gets stuck Symptoms are getting worse. She is not discussed this with primary care doctor   Continues to have chronic balance issues  OSA, on CPAP  Chronic weakness in her legs, arms   Denies having significant problems with shortness of breath, no chest pain, no significant leg edema  EKG on today's visit shows normal sinus rhythm with rate 71 bpm, nonspecific T wave abnormality  Other past medical history reviewed   Prior echocardiogram showing ejection fraction 50%,  Done through the [facility] system  Previously seen at [facility]. Much of the details below provided from the [facility] hospital notes (care anywhere). She reports that she has had progressive difficulty walking, now requires someone to walk with in addition to a  walker. She quit working in 1998 as she was unable to walk well. She has been told she has low heart rate for many years, in general has chronic fatigue, shortness of breath, worse with exertion. She does not feel particular asymptomatic from her bradycardia. She reports that she has had a Holter every year that has shown bradycardia. Heart rate seems to improve with exertion. Started back on her CPAP in September 2014 with a new machine. Seems to be tolerating this well. She feels better, no wheezing, less fatigued during the day. Her family members also have MD. No episodes of syncope. Does have occasional lightheadedness.  Problem List: 1. Shortness of breath, chest pains a. Stress MRI 04/14/2009: Normal LVEF. No ischemia or infarction. No scar or infiltrative disease. b. MRI 10/2012: EF 65%, no scar, mild AI. 2. Myotonic muscular dystrophy type 1, based on genetic testing, EMG, and exam ECG 3. Mixed hyperlipidemia / hypertriglyceridemia  4. Depression  5. Paget disease s/p retromastoid craniotomy 11/2009 of hyperostosis complicated by postoperative cereballar hematoma and left occipital lobe ischemic infaction, recurrent infarction 02/2010 6. Obstructive sleep apnea Titrated to BIPAP 25/20 cmH2O, started 06/2013   PMH:   has a past medical history of Depression; Dyslipidemia; First degree AV block; History of CVA (cerebrovascular accident); IBS (irritable bowel syndrome); Major depressive disorder, recurrent episode, in full remission ([place]); Major depressive disorder, recurrent episode, mild ([place]); Myotonic dystrophy, type 1 ([place]); OSA (obstructive sleep apnea); Personal history of Paget's disease of bone; Posttraumatic stress disorder; Recurrent major  depression in full remission ([place]); and Sinus bradycardia.  PSH:    Past Surgical History:  Procedure Laterality Date  . brain surgery on Paget's    . CATARACT EXTRACTION    . COLONOSCOPY    . RETINAL DETACHMENT SURGERY    . TOTAL ABDOMINAL  HYSTERECTOMY      Current Outpatient Prescriptions  Medication Sig Dispense Refill  . buPROPion (WELLBUTRIN SR) 150 MG 12 hr tablet Take 1 tablet (150 mg total) by mouth every morning. 90 tablet 1  . fenofibrate 160 MG tablet Take by mouth.    . gabapentin (NEURONTIN) 300 MG capsule Take 300 mg by mouth 4 (four) times daily.    Marland Kitchen glimepiride (AMARYL) 2 MG tablet     . NON FORMULARY CPAP    . QUEtiapine (SEROQUEL) 100 MG tablet Take 1 tablet (100 mg total) by mouth at bedtime. 90 tablet 1  . sertraline (ZOLOFT) 100 MG tablet Take 1 tablet (100 mg total) by mouth daily. 90 tablet 1  . traMADol (ULTRAM) 50 MG tablet every 6 (six) hours as needed (take three or four as needed for pain).     Marland Kitchen zolpidem (AMBIEN) 5 MG tablet Take 1 tablet (5 mg total) by mouth at bedtime as needed for sleep. 90 tablet 1  . albuterol (PROAIR HFA) 108 (90 BASE) MCG/ACT inhaler Inhale into the lungs.     No current facility-administered medications for this visit.      Allergies:   Other; Actos  [pioglitazone]; and Metformin   Social History:  The patient  reports that she has never smoked. She has never used smokeless tobacco. She reports that she does not drink alcohol or use drugs.   Family History:   She was adopted. Family history is unknown by patient.    Review of Systems: Review of Systems  Constitutional: Positive for weight loss.  Respiratory: Negative.   Cardiovascular: Negative.   Gastrointestinal: Negative.        Difficulty swallowing food, food gets stuck, regurgitates  Musculoskeletal: Negative.   Neurological: Negative.   Psychiatric/Behavioral: Negative.   All other systems reviewed and are negative.    PHYSICAL EXAM: VS:  BP 120/72 (BP Location: Left Arm, Patient Position: Sitting, Cuff Size: Normal)   Pulse 71   Ht 5' 7.5" (1.715 m)   Wt 172 lb (78 kg)   BMI 26.54 kg/m  , BMI Body mass index is 26.54 kg/m. GEN: Well nourished, well developed, in no acute distress ,  presenting in a wheelchair HEENT: normal  Neck: no JVD, carotid bruits, or masses Cardiac: RRR; no murmurs, rubs, or gallops,no edema  Respiratory:  clear to auscultation bilaterally, normal work of breathing GI: soft, nontender, nondistended, + BS  MS: no deformity or atrophy  Skin: warm and dry, no rash Neuro:  Strength and sensation are intact Psych: euthymic mood, full affect    Recent Labs: No results found for requested labs within last 8760 hours.    Lipid Panel Lab Results  Component Value Date   CHOL 107 10/29/2011   HDL 20 (L) 10/29/2011   LDLCALC 46 10/29/2011   TRIG 206 (H) 10/29/2011      Wt Readings from Last 3 Encounters:  11/16/16 172 lb (78 kg)  11/16/15 178 lb (80.7 kg)  08/24/15 178 lb 0.3 oz (80.7 kg)       ASSESSMENT AND PLAN:  Cerebrovascular accident, old - Plan: EKG 12-Lead She has recovered well from her stroke,  weakness secondary to muscular  dystrophy  Controlled type 2 diabetes mellitus without complication, without long-term current use of insulin ([place]) - Plan: EKG 12-Lead hemoglobin A1c 5.7, also with recent weight loss  Esophageal dysphagia Long discussion concerning her symptoms, worsening over the past several months Concerning for esophageal stricture. Recommended she see GI, we have contacted the office of Dr. Allen Norris at her request, made an appointment  Bradycardia Heart rate currently not a major issue, Rate in the 70s on today's visit  Shortness of breath Not very active at baseline, denies having significant shortness of breath on exertion   Total encounter time more than 25 minutes  Greater than 50% was spent in counseling and coordination of care with the patient   Disposition:   F/U  12 months   Orders Placed This Encounter  Procedures  . EKG 12-Lead     Signed, Esmond Plants, M.D., Ph.D. 11/16/2016  [place], [place]

## 2016-11-16 NOTE — Patient Instructions (Addendum)
Phone number for Dr. Allen Norris (gastro):    Medication Instructions:   No medication changes made  Labwork:  No new labs needed  Testing/Procedures:  No further testing at this time   I recommend watching educational videos on topics of interest to you at:       [email or web address]  Enter code: [email or web address]    Follow-Up: It was a pleasure seeing you in the office today. Please call us if you have new issues that need to be addressed before your next appt.  [phone]  Your physician wants you to follow-up in: 12 months.  You will receive a reminder letter in the mail two months in advance. If you don't receive a letter, please call our office to schedule the follow-up appointment.  If you need a refill on your cardiac medications before your next appointment, please call your pharmacy.

## 2016-12-31 ENCOUNTER — Other Ambulatory Visit: Payer: Self-pay | Admitting: Student

## 2016-12-31 DIAGNOSIS — R1310 Dysphagia, unspecified: Secondary | ICD-10-CM | POA: Diagnosis not present

## 2017-01-02 ENCOUNTER — Ambulatory Visit
Admission: RE | Admit: 2017-01-02 | Discharge: 2017-01-02 | Disposition: A | Discharge status: Home / Self Care | Payer: Medicare HMO | Source: Ambulatory Visit | Attending: Student | Admitting: Student

## 2017-01-02 DIAGNOSIS — K219 Gastro-esophageal reflux disease without esophagitis: Secondary | ICD-10-CM | POA: Insufficient documentation

## 2017-01-02 DIAGNOSIS — K224 Dyskinesia of esophagus: Secondary | ICD-10-CM | POA: Insufficient documentation

## 2017-01-02 DIAGNOSIS — R1310 Dysphagia, unspecified: Secondary | ICD-10-CM | POA: Insufficient documentation

## 2017-01-17 DIAGNOSIS — G4736 Sleep related hypoventilation in conditions classified elsewhere: Secondary | ICD-10-CM | POA: Diagnosis not present

## 2017-01-17 DIAGNOSIS — G4733 Obstructive sleep apnea (adult) (pediatric): Secondary | ICD-10-CM | POA: Diagnosis not present

## 2017-02-04 ENCOUNTER — Telehealth: Payer: Self-pay

## 2017-02-04 DIAGNOSIS — F329 Major depressive disorder, single episode, unspecified: Secondary | ICD-10-CM | POA: Diagnosis not present

## 2017-02-04 DIAGNOSIS — G7111 Myotonic muscular dystrophy: Secondary | ICD-10-CM | POA: Diagnosis not present

## 2017-02-04 DIAGNOSIS — G4733 Obstructive sleep apnea (adult) (pediatric): Secondary | ICD-10-CM | POA: Diagnosis not present

## 2017-02-04 NOTE — Telephone Encounter (Signed)
pt called states she needs a refill on her [place]. pt states that is out.  pt was last seen on  11-15-16 next appt is  05-16-17

## 2017-02-05 ENCOUNTER — Other Ambulatory Visit: Payer: Self-pay | Admitting: Psychiatry

## 2017-02-05 DIAGNOSIS — E119 Type 2 diabetes mellitus without complications: Secondary | ICD-10-CM | POA: Diagnosis not present

## 2017-02-05 DIAGNOSIS — E782 Mixed hyperlipidemia: Secondary | ICD-10-CM | POA: Diagnosis not present

## 2017-02-05 MED ORDER — ZOLPIDEM TARTRATE 5 MG PO TABS: 5.0000 mg | ORAL_TABLET | Freq: Every evening | ORAL | 1 refills | Status: DC | PRN

## 2017-02-05 NOTE — Telephone Encounter (Signed)
She actually still had a refill on her last prescription, I confirmed that with [employer]. Nevertheless I called in a refill with numb so she definitely has plenty of access.

## 2017-02-05 NOTE — Progress Notes (Signed)
Called in refill  

## 2017-02-12 DIAGNOSIS — E782 Mixed hyperlipidemia: Secondary | ICD-10-CM | POA: Diagnosis not present

## 2017-02-12 DIAGNOSIS — G4733 Obstructive sleep apnea (adult) (pediatric): Secondary | ICD-10-CM | POA: Diagnosis not present

## 2017-02-12 DIAGNOSIS — E119 Type 2 diabetes mellitus without complications: Secondary | ICD-10-CM | POA: Diagnosis not present

## 2017-02-15 DIAGNOSIS — K224 Dyskinesia of esophagus: Secondary | ICD-10-CM | POA: Diagnosis not present

## 2017-02-21 ENCOUNTER — Other Ambulatory Visit: Payer: Self-pay | Admitting: Obstetrics and Gynecology

## 2017-02-21 DIAGNOSIS — N83201 Unspecified ovarian cyst, right side: Secondary | ICD-10-CM | POA: Diagnosis not present

## 2017-02-21 DIAGNOSIS — N952 Postmenopausal atrophic vaginitis: Secondary | ICD-10-CM | POA: Diagnosis not present

## 2017-02-21 DIAGNOSIS — Z1211 Encounter for screening for malignant neoplasm of colon: Secondary | ICD-10-CM | POA: Diagnosis not present

## 2017-02-21 DIAGNOSIS — N83291 Other ovarian cyst, right side: Secondary | ICD-10-CM | POA: Diagnosis not present

## 2017-02-21 DIAGNOSIS — Z1272 Encounter for screening for malignant neoplasm of vagina: Secondary | ICD-10-CM | POA: Diagnosis not present

## 2017-02-21 DIAGNOSIS — Z01419 Encounter for gynecological examination (general) (routine) without abnormal findings: Secondary | ICD-10-CM | POA: Diagnosis not present

## 2017-02-21 DIAGNOSIS — Z1231 Encounter for screening mammogram for malignant neoplasm of breast: Secondary | ICD-10-CM

## 2017-03-14 ENCOUNTER — Ambulatory Visit
Admission: RE | Admit: 2017-03-14 | Discharge: 2017-03-14 | Disposition: A | Discharge status: Home / Self Care | Payer: Medicare HMO | Source: Ambulatory Visit | Attending: Obstetrics and Gynecology | Admitting: Obstetrics and Gynecology

## 2017-03-14 DIAGNOSIS — Z1231 Encounter for screening mammogram for malignant neoplasm of breast: Secondary | ICD-10-CM | POA: Diagnosis not present

## 2017-04-20 ENCOUNTER — Other Ambulatory Visit: Payer: Self-pay | Admitting: Psychiatry

## 2017-05-16 ENCOUNTER — Encounter: Payer: Self-pay | Admitting: Psychiatry

## 2017-05-16 ENCOUNTER — Ambulatory Visit (INDEPENDENT_AMBULATORY_CARE_PROVIDER_SITE_OTHER): Payer: Medicare HMO | Admitting: Psychiatry

## 2017-05-16 VITALS — BP 106/77 | HR 62 | Temp 97.7°F

## 2017-05-16 DIAGNOSIS — F4310 Post-traumatic stress disorder, unspecified: Secondary | ICD-10-CM | POA: Diagnosis not present

## 2017-05-16 DIAGNOSIS — F332 Major depressive disorder, recurrent severe without psychotic features: Secondary | ICD-10-CM

## 2017-05-16 DIAGNOSIS — F341 Dysthymic disorder: Secondary | ICD-10-CM | POA: Diagnosis not present

## 2017-05-16 MED ORDER — QUETIAPINE FUMARATE 100 MG PO TABS: 100.0000 mg | ORAL_TABLET | Freq: Every day | ORAL | 1 refills | Status: DC

## 2017-05-16 MED ORDER — SERTRALINE HCL 100 MG PO TABS: 100.0000 mg | ORAL_TABLET | Freq: Every day | ORAL | 1 refills | Status: DC

## 2017-05-16 MED ORDER — BUPROPION HCL ER (SR) 150 MG PO TB12: 150.0000 mg | ORAL_TABLET | Freq: Every morning | ORAL | 1 refills | Status: DC

## 2017-05-16 MED ORDER — ZOLPIDEM TARTRATE 5 MG PO TABS
5.0000 mg | ORAL_TABLET | Freq: Every evening | ORAL | 1 refills | Status: DC | PRN
Start: 2017-05-16 — End: 2017-09-09

## 2017-05-16 NOTE — Progress Notes (Signed)
Follow-up for this 66 year old woman with a history of anxiety and depression. Has no new complaints. Reports that she's been doing quite well recently. Health has been stable. Tolerating medicine well.  Neatly dressed and groomed. Good eye contact. Normal psychomotor activity for her. Speech normal rate and tone affect euthymic no suicidal or homicidal ideation. Alert and oriented 4 good judgment and insight.  Renew medication. Supportive counseling. No change to treatment plan. Follow-up in 6 months

## 2017-07-02 DIAGNOSIS — G4736 Sleep related hypoventilation in conditions classified elsewhere: Secondary | ICD-10-CM | POA: Diagnosis not present

## 2017-07-02 DIAGNOSIS — G4733 Obstructive sleep apnea (adult) (pediatric): Secondary | ICD-10-CM | POA: Diagnosis not present

## 2017-08-06 DIAGNOSIS — G7111 Myotonic muscular dystrophy: Secondary | ICD-10-CM | POA: Diagnosis not present

## 2017-08-06 DIAGNOSIS — F329 Major depressive disorder, single episode, unspecified: Secondary | ICD-10-CM | POA: Diagnosis not present

## 2017-08-06 DIAGNOSIS — E782 Mixed hyperlipidemia: Secondary | ICD-10-CM | POA: Diagnosis not present

## 2017-08-06 DIAGNOSIS — G4733 Obstructive sleep apnea (adult) (pediatric): Secondary | ICD-10-CM | POA: Diagnosis not present

## 2017-08-06 DIAGNOSIS — E119 Type 2 diabetes mellitus without complications: Secondary | ICD-10-CM | POA: Diagnosis not present

## 2017-08-15 DIAGNOSIS — G4733 Obstructive sleep apnea (adult) (pediatric): Secondary | ICD-10-CM | POA: Diagnosis not present

## 2017-08-19 DIAGNOSIS — Z0000 Encounter for general adult medical examination without abnormal findings: Secondary | ICD-10-CM | POA: Diagnosis not present

## 2017-08-19 DIAGNOSIS — Z23 Encounter for immunization: Secondary | ICD-10-CM | POA: Diagnosis not present

## 2017-08-19 DIAGNOSIS — E782 Mixed hyperlipidemia: Secondary | ICD-10-CM | POA: Diagnosis not present

## 2017-08-19 DIAGNOSIS — E119 Type 2 diabetes mellitus without complications: Secondary | ICD-10-CM | POA: Diagnosis not present

## 2017-08-21 ENCOUNTER — Other Ambulatory Visit: Payer: Self-pay | Admitting: Psychiatry

## 2017-08-26 ENCOUNTER — Telehealth: Payer: Self-pay

## 2017-08-26 NOTE — Telephone Encounter (Signed)
I thought that I did.  I will resubmit it however.

## 2017-08-26 NOTE — Telephone Encounter (Signed)
pt called states that she needs a rx sent to [facility] for her Lorrin Mais.     zolpidem (AMBIEN) 5 MG tablet 90 tablet 1 05/16/2017    Sig - Route: Take 1 tablet (5 mg total) by mouth at bedtime as needed for sleep. - Oral   Class: Phone In

## 2017-09-01 IMAGING — MG MM DIGITAL SCREENING BILAT W/ CAD
4 series · 4 of 4 positions shown · non-contrast
Comparison: Previous exam(s).

CLINICAL DATA: Screening.

EXAM:
DIGITAL SCREENING BILATERAL MAMMOGRAM WITH CAD

[L CC]
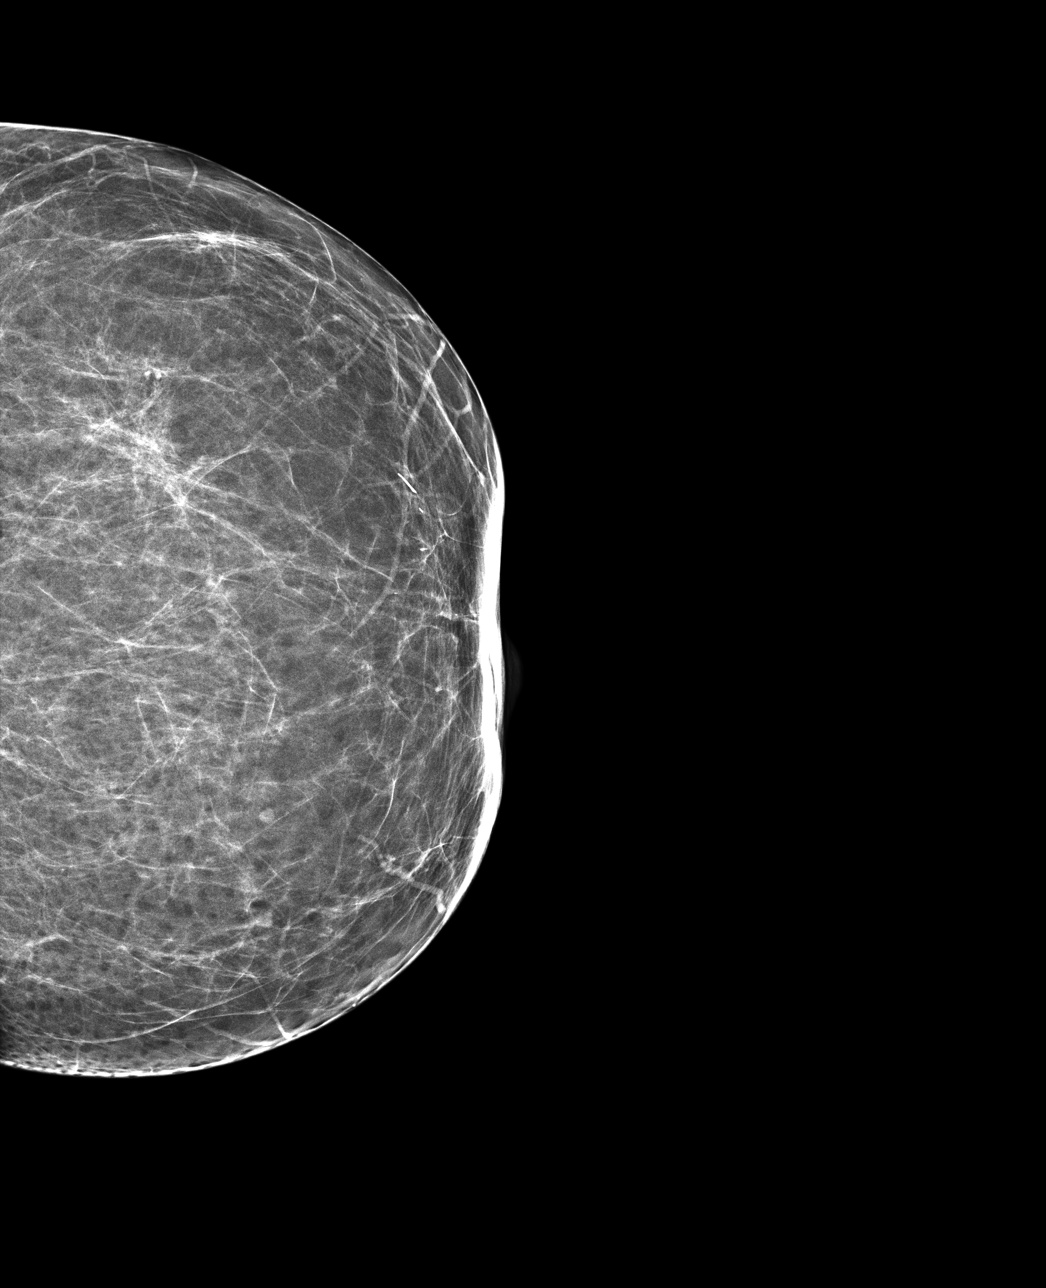

[R MLO]
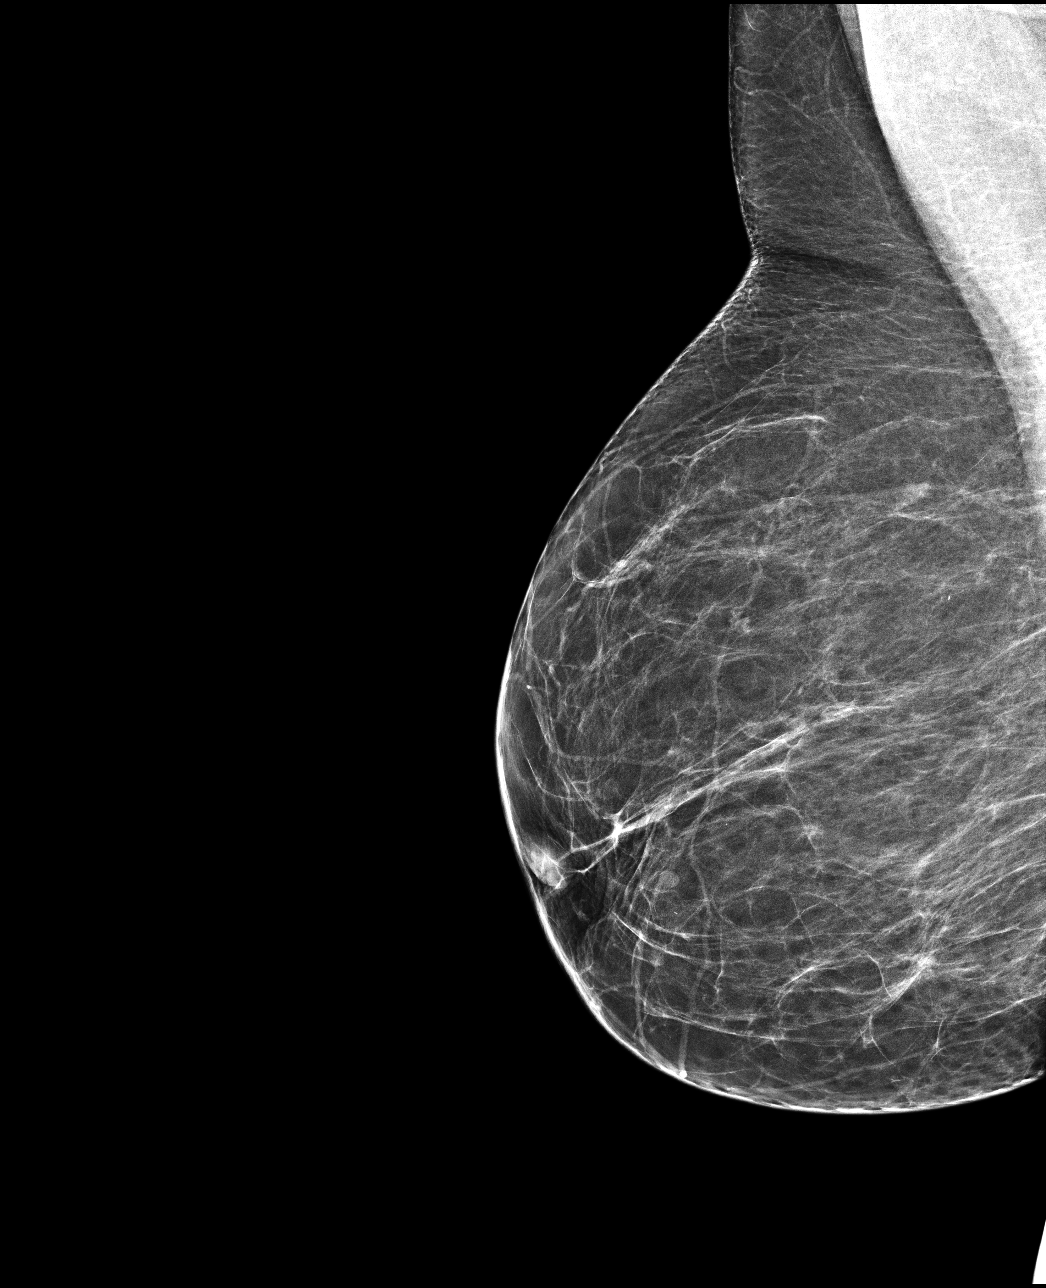

[L MLO]
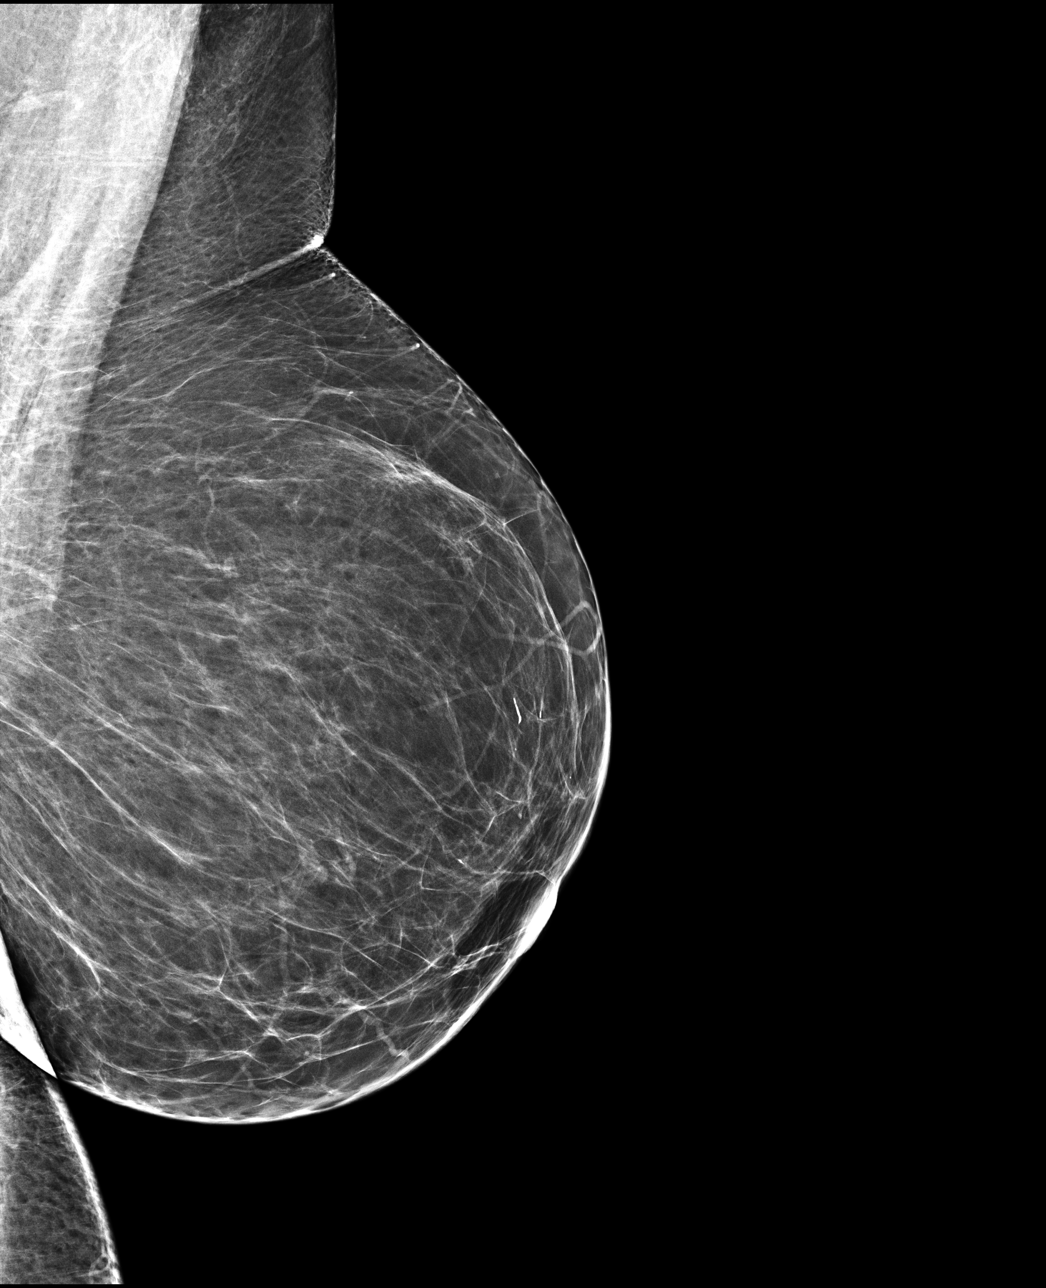

[R CC]
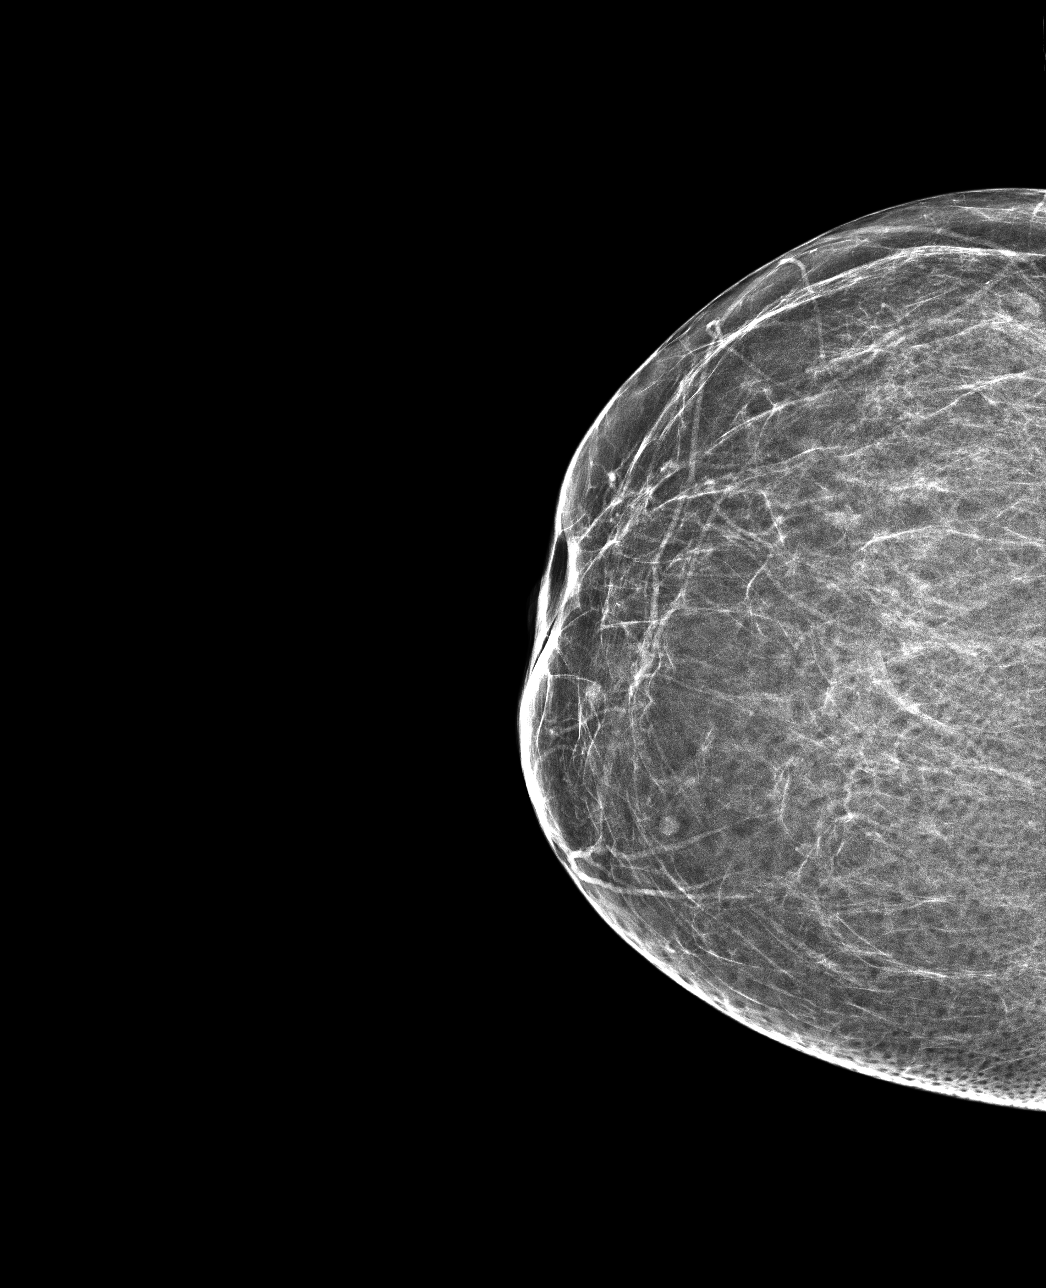

[4 of 4 positions shown; findings below may reference images not displayed]

ACR Breast Density Category b: There are scattered areas of
fibroglandular density.
FINDINGS: There are no findings suspicious for malignancy. Images were
processed with CAD.
IMPRESSION: No mammographic evidence of malignancy. A result letter of this
screening mammogram will be mailed directly to the patient.

RECOMMENDATION:
Screening mammogram in one year. (Code:AS-G-LCT)

BI-RADS CATEGORY  1: Negative.

## 2017-09-03 ENCOUNTER — Telehealth: Payer: Self-pay

## 2017-09-03 NOTE — Telephone Encounter (Signed)
PT CALLED STATES SHE BEEN OUT FOR AMBIEN FOR TWO WEEKS NEED REFILL SENT TO [facility].    Disp Refills Start End   zolpidem (AMBIEN) 5 MG tablet 90 tablet 1 05/16/2017    Sig - Route: Take 1 tablet (5 mg total) by mouth at bedtime as needed for sleep. - Oral   Class: Phone In

## 2017-09-05 NOTE — Telephone Encounter (Signed)
I faxed this in again last night.  Hopefully that will take care of it.

## 2017-09-09 ENCOUNTER — Other Ambulatory Visit: Payer: Self-pay | Admitting: Psychiatry

## 2017-11-02 ENCOUNTER — Other Ambulatory Visit: Payer: Self-pay | Admitting: Psychiatry

## 2017-11-14 ENCOUNTER — Encounter: Payer: Self-pay | Admitting: Psychiatry

## 2017-11-14 ENCOUNTER — Other Ambulatory Visit: Payer: Self-pay

## 2017-11-14 ENCOUNTER — Ambulatory Visit: Payer: Medicare HMO | Admitting: Psychiatry

## 2017-11-14 VITALS — BP 113/75 | HR 69 | Temp 98.2°F

## 2017-11-14 DIAGNOSIS — F341 Dysthymic disorder: Secondary | ICD-10-CM

## 2017-11-14 DIAGNOSIS — F4310 Post-traumatic stress disorder, unspecified: Secondary | ICD-10-CM

## 2017-11-14 MED ORDER — QUETIAPINE FUMARATE 100 MG PO TABS: 100.0000 mg | ORAL_TABLET | Freq: Every day | ORAL | 1 refills | Status: DC

## 2017-11-14 MED ORDER — SERTRALINE HCL 100 MG PO TABS: 100.0000 mg | ORAL_TABLET | Freq: Every day | ORAL | 1 refills | Status: DC

## 2017-11-14 MED ORDER — ZOLPIDEM TARTRATE 5 MG PO TABS: 5.0000 mg | ORAL_TABLET | Freq: Every evening | ORAL | 1 refills | Status: DC | PRN

## 2017-11-14 MED ORDER — BUPROPION HCL ER (SR) 150 MG PO TB12: 150.0000 mg | ORAL_TABLET | Freq: Every morning | ORAL | 1 refills | Status: DC

## 2017-11-14 NOTE — Progress Notes (Signed)
Follow-up 67 year old woman with chronic anxiety and depression.  No new complaints.  Spent some time reviewing all the stresses in her life most particularly that her adult children are moving back into the house with her.  Patient is neatly dressed and groomed.  Good eye contact.  No psychotic symptoms.  No suicidality.  Overall functioning pretty well tolerating medicine without difficulty.  No change to current medication.  I will try to make sure they all go into her mail order pharmacy electronically.  We can follow-up in 6 months

## 2017-11-19 DIAGNOSIS — J40 Bronchitis, not specified as acute or chronic: Secondary | ICD-10-CM | POA: Diagnosis not present

## 2017-11-19 DIAGNOSIS — R05 Cough: Secondary | ICD-10-CM | POA: Diagnosis not present

## 2017-12-10 DIAGNOSIS — E119 Type 2 diabetes mellitus without complications: Secondary | ICD-10-CM | POA: Diagnosis not present

## 2017-12-10 DIAGNOSIS — E782 Mixed hyperlipidemia: Secondary | ICD-10-CM | POA: Diagnosis not present

## 2017-12-17 DIAGNOSIS — E119 Type 2 diabetes mellitus without complications: Secondary | ICD-10-CM | POA: Diagnosis not present

## 2017-12-17 DIAGNOSIS — E782 Mixed hyperlipidemia: Secondary | ICD-10-CM | POA: Diagnosis not present

## 2017-12-17 DIAGNOSIS — R55 Syncope and collapse: Secondary | ICD-10-CM | POA: Diagnosis not present

## 2018-01-07 NOTE — Progress Notes (Signed)
Cardiology Office Note  Date:  01/08/2018   ID:  Rebekah Warner, DOB Nov 11, 1950, MRN [id number]  PCP:  Rebekah Warner, Rebekah Warner   Chief Complaint  Patient presents with  . other    12 month f/u c/o pt having fall due to weakness and left arm numbness. Meds reviewed verbally with pt.    HPI:  Rebekah Warner is an 67 y.o. old female with long  history of  myotonic muscular dystrophy type 1 symptoms initially presenting in 1998,  shortness of breath, bradycardia.   Prior history of Paget's disease with complications and prolonged hospital course.  Hyperlipidemia,  CVA x2, first after surgery and one after a fall while in rehab Her son died unexpectedly in sleep in 22-Jun-2013 She had cognitive impairment after stroke, but has made a good recovery chronic balance issues  OSA, on CPAP Chronic weakness in her legs, arms , falls on a daily basis She presents for routine follow-up of her shortness of breath symptoms and falls  In follow-up today she presents with her daughters who live with her They reports she has had Multiple falls Recent episode 1 month ago when she Got out of bed to go to bedside commode in the middle of the night  Fell in the bedroom  Found at 5:30 Am by daughter,  on the ground couple hours Cut the left wrist Left  Family got her up, daughter with muscular dystrophy Must have hit her head as she had bruising on the right side of her head, soreness, unable to wear her CPAP without discomfort Biggest complaint is developing some scabs on her head  Long discussion concerning events of that night, Family reports that she fell, patient even told the daughters that she had fallen when they were picking her up  Did not have her night light on Having frequent falls, 3 falls yesterday Legs are stronger in the morning, weaker in the afternoon  Taking aleve for headaches since the fall 1 month ago  EKG personally reviewed by myself on todays visit Shows normal sinus rhythm  with left bundle branch block rate 78 bpm  other past medical history reviewed Previously reported weight loss, difficulty swallowing Review of weight shows she is down 6 pounds Significant GI issues in the past several months  Food sticks, poor motility of food,  Once the food sticks in her esophagus, she is unable to eat any more or drink  She ends up throws up water after food gets stuck Symptoms are getting worse. She is not discussed this with primary care doctor     Denies having significant problems with shortness of breath, no chest pain, no significant leg edema  EKG on today's visit shows normal sinus rhythm with rate 71 bpm, nonspecific T wave abnormality  Other past medical history reviewed   Prior echocardiogram showing ejection fraction 50%,  Done through the [facility] system  quit working in 1998 as she was unable to walk well.   asymptomatic from her bradycardia.   CPAP in September 2014 with a new machine.  Problem List: 1. Shortness of breath, chest pains a. Stress MRI 04/14/2009: Normal LVEF. No ischemia or infarction. No scar or infiltrative disease. b. MRI 10/2012: EF 65%, no scar, mild AI. 2. Myotonic muscular dystrophy type 1, based on genetic testing, EMG, and exam ECG 3. Mixed hyperlipidemia / hypertriglyceridemia  4. Depression  5. Paget disease s/p retromastoid craniotomy 11/2009 of hyperostosis complicated by postoperative cereballar hematoma and left occipital lobe  ischemic infaction, recurrent infarction 02/2010 6. Obstructive sleep apnea Titrated to BIPAP 25/20 cmH2O, started 06/2013   PMH:   has a past medical history of Depression, Dyslipidemia, First degree AV block, History of CVA (cerebrovascular accident), IBS (irritable bowel syndrome), [name], recurrent episode, in full remission ([place]), [name], recurrent episode, mild ([place]), Myotonic dystrophy, type 1 ([place]), OSA (obstructive sleep apnea), Personal history of  Paget's disease of bone, Posttraumatic stress disorder, [name] ([place]), and Sinus bradycardia.  PSH:    Past Surgical History:  Procedure Laterality Date  . brain surgery on Paget's    . CATARACT EXTRACTION    . COLONOSCOPY    . RETINAL DETACHMENT SURGERY    . TOTAL ABDOMINAL HYSTERECTOMY      Current Outpatient Medications  Medication Sig Dispense Refill  . albuterol (PROAIR HFA) 108 (90 BASE) MCG/ACT inhaler Inhale into the lungs.    Marland Kitchen buPROPion (WELLBUTRIN SR) 150 MG 12 hr tablet Take 1 tablet (150 mg total) by mouth every morning. 90 tablet 1  . fenofibrate 160 MG tablet Take by mouth.    . gabapentin (NEURONTIN) 300 MG capsule Take 300 mg by mouth 4 (four) times daily.    . naproxen sodium (ALEVE) 220 MG tablet Take 220 mg by mouth daily as needed.    . NON FORMULARY CPAP    . QUEtiapine (SEROQUEL) 100 MG tablet Take 1 tablet (100 mg total) by mouth at bedtime. 90 tablet 1  . Red Yeast Rice Extract (RED YEAST RICE PO) Take by mouth daily.    . sertraline (ZOLOFT) 100 MG tablet Take 1 tablet (100 mg total) by mouth daily. 90 tablet 1  . traMADol (ULTRAM) 50 MG tablet every 6 (six) hours as needed (take three or four as needed for pain).     Marland Kitchen zolpidem (AMBIEN) 5 MG tablet Take 1 tablet (5 mg total) by mouth at bedtime as needed. for sleep 90 tablet 1   No current facility-administered medications for this visit.      Allergies:   Other; Actos  [pioglitazone]; and Metformin   Social History:  The patient  reports that she has never smoked. She has never used smokeless tobacco. She reports that she does not drink alcohol or use drugs.   Family History:   She was adopted. Family history is unknown by patient.    Review of Systems: Review of Systems  Constitutional: Negative.   Respiratory: Negative.   Cardiovascular: Negative.   Gastrointestinal: Negative.   Musculoskeletal: Positive for falls.  Neurological: Negative.    Psychiatric/Behavioral: Negative.   All other systems reviewed and are negative.    PHYSICAL EXAM: VS:  BP 138/83 (BP Location: Left Arm, Patient Position: Sitting, Cuff Size: Normal)   Pulse 74   Ht 5' 7.5" (1.715 m)   Wt 165 lb (74.8 kg)   BMI 25.46 kg/m  , BMI Warner mass index is 25.46 kg/m. Constitutional:  oriented to person, place, and time. No distress.  Presents in a wheelchair, unable to stand without assistance HENT:  Head: Normocephalic and atraumatic.  Eyes:  no discharge. No scleral icterus.  Neck: Normal range of motion. Neck supple. No JVD present.  Cardiovascular: Normal rate, regular rhythm, normal heart sounds and intact distal pulses. Exam reveals no gallop and no friction rub. No edema No murmur heard. Pulmonary/Chest: Effort normal and breath sounds normal. No stridor. No respiratory distress.  no wheezes.  no rales.  no tenderness.  Abdominal: Soft.  no distension.  no tenderness.  Musculoskeletal: Normal range of motion.  no  tenderness or deformity.  Neurological:  normal muscle tone. Coordination normal. No atrophy Skin: Skin is warm and dry. No rash noted. not diaphoretic.  Psychiatric:  normal mood and affect. behavior is normal. Thought content normal.   Recent Labs: No results found for requested labs within last 8760 hours.    Lipid Panel Lab Results  Component Value Date   CHOL 107 10/29/2011   HDL 20 (L) 10/29/2011   LDLCALC 46 10/29/2011   TRIG 206 (H) 10/29/2011      Wt Readings from Last 3 Encounters:  01/08/18 165 lb (74.8 kg)  11/16/16 172 lb (78 kg)  11/16/15 178 lb (80.7 kg)       ASSESSMENT AND PLAN:  Cerebrovascular accident, old - Plan: EKG 12-Lead  recovered well from her stroke,  weakness secondary to muscular dystrophy More frequent falls  Frequent falls Long discussion concerning strategies to prevent falls Recent fall in the middle of the night likely responsible for injury 1 month ago going to the  bathroom Uses a bedside commode, has hand railings everywhere to help support her Despite this still having frequent falls and injury Detailed discussion with patient and 2 daughters, they do not think she had syncope only a fall They have not seen her of any other syncope or near syncope symptoms Discussed syncope workup that could be done if she has any symptoms concerning for syncope  Controlled type 2 diabetes mellitus without complication, without long-term current use of insulin ([place]) - Plan: EKG 12-Lead Managed by primary care  Esophageal dysphagia Previously seen by GI Was less of an issue on today's visit  Bradycardia Heart rate in the 70s, no further workup needed  Shortness of breath Not active, high fall risk, only walks short distances with hand railing and assistance, unable to walk with a walker unless she has significant assistance   Total encounter time more than 45 minutes  Greater than 50% was spent in counseling and coordination of care with the patient  Disposition:   F/U  12 months as needed   Orders Placed This Encounter  Procedures  . EKG 12-Lead     Signed, Esmond Plants, M.D., Ph.D. 01/08/2018  [place], [place]

## 2018-01-08 ENCOUNTER — Ambulatory Visit: Payer: Medicare HMO | Admitting: Cardiovascular Disease

## 2018-01-08 ENCOUNTER — Encounter: Payer: Self-pay | Admitting: Cardiovascular Disease

## 2018-01-08 VITALS — BP 138/83 | HR 74 | Ht 67.5 in | Wt 165.0 lb

## 2018-01-08 DIAGNOSIS — R1310 Dysphagia, unspecified: Secondary | ICD-10-CM

## 2018-01-08 DIAGNOSIS — E785 Hyperlipidemia, unspecified: Secondary | ICD-10-CM | POA: Diagnosis not present

## 2018-01-08 DIAGNOSIS — E119 Type 2 diabetes mellitus without complications: Secondary | ICD-10-CM

## 2018-01-08 DIAGNOSIS — R0602 Shortness of breath: Secondary | ICD-10-CM | POA: Diagnosis not present

## 2018-01-08 DIAGNOSIS — Z8673 Personal history of transient ischemic attack (TIA), and cerebral infarction without residual deficits: Secondary | ICD-10-CM

## 2018-01-08 DIAGNOSIS — R1319 Other dysphagia: Secondary | ICD-10-CM

## 2018-01-08 NOTE — Patient Instructions (Signed)

## 2018-02-04 DIAGNOSIS — G4733 Obstructive sleep apnea (adult) (pediatric): Secondary | ICD-10-CM | POA: Diagnosis not present

## 2018-02-04 DIAGNOSIS — G7111 Myotonic muscular dystrophy: Secondary | ICD-10-CM | POA: Diagnosis not present

## 2018-02-04 DIAGNOSIS — F329 Major depressive disorder, single episode, unspecified: Secondary | ICD-10-CM | POA: Diagnosis not present

## 2018-02-08 DIAGNOSIS — H52209 Unspecified astigmatism, unspecified eye: Secondary | ICD-10-CM | POA: Diagnosis not present

## 2018-02-08 DIAGNOSIS — H5203 Hypermetropia, bilateral: Secondary | ICD-10-CM | POA: Diagnosis not present

## 2018-02-08 DIAGNOSIS — H524 Presbyopia: Secondary | ICD-10-CM | POA: Diagnosis not present

## 2018-02-08 DIAGNOSIS — H5213 Myopia, bilateral: Secondary | ICD-10-CM | POA: Diagnosis not present

## 2018-03-05 ENCOUNTER — Other Ambulatory Visit: Payer: Self-pay

## 2018-03-05 ENCOUNTER — Emergency Department: Payer: Medicare HMO

## 2018-03-05 ENCOUNTER — Encounter: Payer: Self-pay | Admitting: Emergency Medicine

## 2018-03-05 ENCOUNTER — Observation Stay
Admission: EM | Admit: 2018-03-05 | Discharge: 2018-03-09 | Disposition: A | Discharge status: Home / Self Care | Payer: Medicare HMO | Attending: Internal Medicine | Admitting: Internal Medicine

## 2018-03-05 DIAGNOSIS — R4020 Unspecified coma: Secondary | ICD-10-CM | POA: Diagnosis not present

## 2018-03-05 DIAGNOSIS — Z8673 Personal history of transient ischemic attack (TIA), and cerebral infarction without residual deficits: Secondary | ICD-10-CM | POA: Insufficient documentation

## 2018-03-05 DIAGNOSIS — Z9889 Other specified postprocedural states: Secondary | ICD-10-CM | POA: Insufficient documentation

## 2018-03-05 DIAGNOSIS — G9389 Other specified disorders of brain: Secondary | ICD-10-CM | POA: Diagnosis not present

## 2018-03-05 DIAGNOSIS — G253 Myoclonus: Secondary | ICD-10-CM | POA: Diagnosis not present

## 2018-03-05 DIAGNOSIS — Z79899 Other long term (current) drug therapy: Secondary | ICD-10-CM | POA: Diagnosis not present

## 2018-03-05 DIAGNOSIS — I447 Left bundle-branch block, unspecified: Secondary | ICD-10-CM | POA: Diagnosis not present

## 2018-03-05 DIAGNOSIS — N3001 Acute cystitis with hematuria: Secondary | ICD-10-CM | POA: Diagnosis not present

## 2018-03-05 DIAGNOSIS — G7111 Myotonic muscular dystrophy: Secondary | ICD-10-CM | POA: Insufficient documentation

## 2018-03-05 DIAGNOSIS — Z87440 Personal history of urinary (tract) infections: Secondary | ICD-10-CM | POA: Insufficient documentation

## 2018-03-05 DIAGNOSIS — N390 Urinary tract infection, site not specified: Secondary | ICD-10-CM | POA: Diagnosis not present

## 2018-03-05 DIAGNOSIS — R4182 Altered mental status, unspecified: Secondary | ICD-10-CM | POA: Diagnosis not present

## 2018-03-05 DIAGNOSIS — F329 Major depressive disorder, single episode, unspecified: Secondary | ICD-10-CM | POA: Insufficient documentation

## 2018-03-05 DIAGNOSIS — Z7984 Long term (current) use of oral hypoglycemic drugs: Secondary | ICD-10-CM | POA: Diagnosis not present

## 2018-03-05 DIAGNOSIS — G9341 Metabolic encephalopathy: Secondary | ICD-10-CM | POA: Diagnosis not present

## 2018-03-05 DIAGNOSIS — E119 Type 2 diabetes mellitus without complications: Secondary | ICD-10-CM | POA: Diagnosis not present

## 2018-03-05 DIAGNOSIS — E860 Dehydration: Secondary | ICD-10-CM | POA: Insufficient documentation

## 2018-03-05 DIAGNOSIS — B9620 Unspecified Escherichia coli [E. coli] as the cause of diseases classified elsewhere: Secondary | ICD-10-CM | POA: Diagnosis not present

## 2018-03-05 HISTORY — DX: Cerebral infarction, unspecified: I63.9

## 2018-03-05 HISTORY — DX: Type 2 diabetes mellitus without complications: E11.9

## 2018-03-05 LAB — CBC WITH DIFFERENTIAL/PLATELET
BASOS PCT: 1 %
Basophils Absolute: 0.1 10*3/uL (ref 0–0.1)
EOS ABS: 0.2 10*3/uL (ref 0–0.7)
EOS PCT: 2 %
HCT: 46.1 % (ref 35.0–47.0)
Hemoglobin: 15.7 g/dL (ref 12.0–16.0)
Lymphocytes Relative: 26 %
Lymphs Abs: 1.9 10*3/uL (ref 1.0–3.6)
MCH: 29.9 pg (ref 26.0–34.0)
MCHC: 34.2 g/dL (ref 32.0–36.0)
MCV: 87.6 fL (ref 80.0–100.0)
MONOS PCT: 6 %
Monocytes Absolute: 0.5 10*3/uL (ref 0.2–0.9)
NEUTROS PCT: 65 %
Neutro Abs: 4.7 10*3/uL (ref 1.4–6.5)
PLATELETS: 294 10*3/uL (ref 150–440)
RBC: 5.26 MIL/uL — ABNORMAL HIGH (ref 3.80–5.20)
RDW: 13.9 % (ref 11.5–14.5)
WBC: 7.2 10*3/uL (ref 3.6–11.0)

## 2018-03-05 LAB — BLOOD GAS, VENOUS
Acid-Base Excess: 1.7 mmol/L (ref 0.0–2.0)
BICARBONATE: 24.6 mmol/L (ref 20.0–28.0)
O2 SAT: 85.1 %
PATIENT TEMPERATURE: 37
pCO2, Ven: 33 mmHg — ABNORMAL LOW (ref 44.0–60.0)
pH, Ven: 7.48 — ABNORMAL HIGH (ref 7.250–7.430)
pO2, Ven: 46 mmHg — ABNORMAL HIGH (ref 32.0–45.0)

## 2018-03-05 LAB — URINALYSIS, COMPLETE (UACMP) WITH MICROSCOPIC
Bilirubin Urine: NEGATIVE
GLUCOSE, UA: NEGATIVE mg/dL
KETONES UR: 5 mg/dL — AB
NITRITE: POSITIVE — AB
PH: 5 (ref 5.0–8.0)
Protein, ur: 30 mg/dL — AB
Specific Gravity, Urine: 1.02 (ref 1.005–1.030)

## 2018-03-05 LAB — COMPREHENSIVE METABOLIC PANEL
ALT: 26 U/L (ref 14–54)
AST: 33 U/L (ref 15–41)
Albumin: 3.8 g/dL (ref 3.5–5.0)
Alkaline Phosphatase: 44 U/L (ref 38–126)
Anion gap: 11 (ref 5–15)
BUN: 26 mg/dL — ABNORMAL HIGH (ref 6–20)
CALCIUM: 9.2 mg/dL (ref 8.9–10.3)
CO2: 21 mmol/L — AB (ref 22–32)
Chloride: 103 mmol/L (ref 101–111)
Creatinine, Ser: 0.9 mg/dL (ref 0.44–1.00)
GFR calc non Af Amer: >60 mL/min (ref >60)
Glucose, Bld: 129 mg/dL — ABNORMAL HIGH (ref 65–99)
POTASSIUM: 4 mmol/L (ref 3.5–5.1)
SODIUM: 135 mmol/L (ref 135–145)
Total Bilirubin: 1.4 mg/dL — ABNORMAL HIGH (ref 0.3–1.2)
Total Protein: 8.5 g/dL — ABNORMAL HIGH (ref 6.5–8.1)

## 2018-03-05 LAB — URINE DRUG SCREEN, QUALITATIVE (ARMC ONLY)
AMPHETAMINES, UR SCREEN: NOT DETECTED
BENZODIAZEPINE, UR SCRN: NOT DETECTED
Barbiturates, Ur Screen: NOT DETECTED
Cannabinoid 50 Ng, Ur ~~LOC~~: NOT DETECTED
Cocaine Metabolite,Ur ~~LOC~~: NOT DETECTED
MDMA (Ecstasy)Ur Screen: POSITIVE — AB
Methadone Scn, Ur: NOT DETECTED
OPIATE, UR SCREEN: NOT DETECTED
PHENCYCLIDINE (PCP) UR S: NOT DETECTED
Tricyclic, Ur Screen: NOT DETECTED

## 2018-03-05 LAB — GLUCOSE, CAPILLARY
GLUCOSE-CAPILLARY: 145 mg/dL — AB (ref 65–99)
Glucose-Capillary: 132 mg/dL — ABNORMAL HIGH (ref 65–99)
Glucose-Capillary: 114 mg/dL — ABNORMAL HIGH (ref 65–99)

## 2018-03-05 MED ORDER — SODIUM CHLORIDE 0.9 % IV SOLN
Freq: Once | INTRAVENOUS | Status: AC
  Administered 2018-03-05: 12:00:00 via INTRAVENOUS

## 2018-03-05 MED ORDER — ONDANSETRON HCL 4 MG PO TABS: 4.0000 mg | ORAL_TABLET | Freq: Four times a day (QID) | ORAL | Status: DC | PRN

## 2018-03-05 MED ORDER — SODIUM CHLORIDE 0.9 % IV SOLN
1.0000 g | Freq: Once | INTRAVENOUS | Status: AC
  Administered 2018-03-05: 1 g via INTRAVENOUS
  Filled 2018-03-05: qty 10

## 2018-03-05 MED ORDER — BUPROPION HCL ER (SR) 150 MG PO TB12
150.0000 mg | ORAL_TABLET | Freq: Every day | ORAL | Status: DC
  Administered 2018-03-05 – 2018-03-08 (×4): 150 mg via ORAL
  Filled 2018-03-05 (×6): qty 1

## 2018-03-05 MED ORDER — ONDANSETRON HCL 4 MG/2ML IJ SOLN: 4.0000 mg | Freq: Four times a day (QID) | INTRAMUSCULAR | Status: DC | PRN

## 2018-03-05 MED ORDER — ACETAMINOPHEN 325 MG PO TABS: 650.0000 mg | ORAL_TABLET | Freq: Four times a day (QID) | ORAL | Status: DC | PRN

## 2018-03-05 MED ORDER — SERTRALINE HCL 50 MG PO TABS
100.0000 mg | ORAL_TABLET | Freq: Every day | ORAL | Status: DC
  Administered 2018-03-05 – 2018-03-08 (×4): 100 mg via ORAL
  Filled 2018-03-05 (×4): qty 2

## 2018-03-05 MED ORDER — SODIUM CHLORIDE 0.9 % IV SOLN
1.0000 g | INTRAVENOUS | Status: DC
  Administered 2018-03-06 – 2018-03-08 (×3): 1 g via INTRAVENOUS
  Filled 2018-03-05 (×3): qty 1
  Filled 2018-03-05: qty 10

## 2018-03-05 MED ORDER — BISACODYL 5 MG PO TBEC: 5.0000 mg | DELAYED_RELEASE_TABLET | Freq: Every day | ORAL | Status: DC | PRN

## 2018-03-05 MED ORDER — SODIUM CHLORIDE 0.9 % IV SOLN
12.5000 mg | Freq: Three times a day (TID) | INTRAVENOUS | Status: DC | PRN
  Administered 2018-03-06: 12.5 mg via INTRAVENOUS
  Filled 2018-03-05: qty 0.5

## 2018-03-05 MED ORDER — INSULIN ASPART 100 UNIT/ML ~~LOC~~ SOLN
0.0000 [IU] | Freq: Three times a day (TID) | SUBCUTANEOUS | Status: DC
  Filled 2018-03-05: qty 1

## 2018-03-05 MED ORDER — ENOXAPARIN SODIUM 40 MG/0.4ML ~~LOC~~ SOLN
40.0000 mg | SUBCUTANEOUS | Status: DC
  Administered 2018-03-05 – 2018-03-08 (×4): 40 mg via SUBCUTANEOUS
  Filled 2018-03-05 (×4): qty 0.4

## 2018-03-05 MED ORDER — ACETAMINOPHEN 650 MG RE SUPP: 650.0000 mg | Freq: Four times a day (QID) | RECTAL | Status: DC | PRN

## 2018-03-05 MED ORDER — QUETIAPINE FUMARATE 25 MG PO TABS
100.0000 mg | ORAL_TABLET | Freq: Every day | ORAL | Status: DC
  Administered 2018-03-05 – 2018-03-08 (×4): 100 mg via ORAL
  Filled 2018-03-05 (×4): qty 4

## 2018-03-05 MED ORDER — SODIUM CHLORIDE 0.9 % IV SOLN
INTRAVENOUS | Status: DC
  Administered 2018-03-05 – 2018-03-06 (×3): via INTRAVENOUS

## 2018-03-05 MED ORDER — INSULIN ASPART 100 UNIT/ML ~~LOC~~ SOLN
0.0000 [IU] | Freq: Three times a day (TID) | SUBCUTANEOUS | Status: DC
  Administered 2018-03-05 – 2018-03-06 (×2): 1 [IU] via SUBCUTANEOUS
  Filled 2018-03-05 (×2): qty 1

## 2018-03-05 MED ORDER — SODIUM CHLORIDE 0.9 % IV SOLN: 1.0000 g | INTRAVENOUS | Status: DC

## 2018-03-05 NOTE — ED Provider Notes (Signed)
[facility] Emergency Department Provider Note    First MD Initiated Contact with Patient 03/05/18 [phone]     (approximate)  I have reviewed the triage vital signs and the nursing notes.   HISTORY  Chief Complaint Altered Mental Status  Level v caveat:  Acute encephalopathy HPI Rebekah Warner is a 67 y.o. female with a history of diabetes as well as stroke presents to the ER with over 2 days of worsening confusion.  Husband at bedside states that she has "gone blank.  ".  No report of fevers.  Has had similar episodes when she was diagnosed with urinary tract infections in the past but this is different and more severe he feels.  Patient denies any pain but is very unreliable as she does not seem to be able to focus long enough to provide adequate answer.  There is no report of fall.  Does have a history of stroke.  No nausea or vomiting.  Past Medical History:  Diagnosis Date  . Diabetes mellitus without complication ([place])   . Stroke [facility])    History reviewed. No pertinent family history. Past Surgical History:  Procedure Laterality Date  . BRAIN SURGERY     Patient Active Problem List   Diagnosis Date Noted  . UTI (urinary tract infection) 03/05/2018      Prior to Admission medications   Medication Sig Start Date End Date Taking? Authorizing Provider  buPROPion (WELLBUTRIN SR) 150 MG 12 hr tablet Take 1 tablet by mouth daily. 01/05/18  Yes [provider]  glimepiride (AMARYL) 1 MG tablet Take 1 tablet by mouth daily. 02/18/18  Yes [provider]  QUEtiapine (SEROQUEL) 100 MG tablet Take 1 tablet by mouth daily. 01/05/18  Yes [provider]  sertraline (ZOLOFT) 100 MG tablet Take 1 tablet by mouth daily. 01/05/18  Yes [provider]  traMADol (ULTRAM) 50 MG tablet Take 1 tablet by mouth every 4 (four) hours as needed. 03/01/18  Yes [provider]  zolpidem (AMBIEN) 5 MG tablet Take 1 tablet by mouth daily.  01/19/18  Yes [provider]    Allergies Patient has no known allergies.    Social History Social History   Tobacco Use  . Smoking status: Never Smoker  . Smokeless tobacco: Never Used  Substance Use Topics  . Alcohol use: Not on file  . Drug use: Not on file    Review of Systems Patient denies headaches, rhinorrhea, blurry vision, numbness, shortness of breath, chest pain, edema, cough, abdominal pain, nausea, vomiting, diarrhea, dysuria, fevers, rashes or hallucinations unless otherwise stated above in HPI. ____________________________________________   PHYSICAL EXAM:  VITAL SIGNS: Vitals:   03/05/18 1100 03/05/18 1254  BP: 123/83 136/78  Pulse: 62 65  Resp: 20 20  Temp:  97.6 F (36.4 C)  SpO2: 99% 98%    Constitutional: Alert but disoriented x 3 ill appearing but in no acute distress. Eyes: Conjunctivae are normal.  Head: Atraumatic. Nose: No congestion/rhinnorhea. Mouth/Throat: Mucous membranes are moist.   Neck: No stridor. Painless ROM.  Cardiovascular: Normal rate, regular rhythm. Grossly normal heart sounds.  Good peripheral circulation. Respiratory: Normal respiratory effort.  No retractions. Lungs CTAB. Gastrointestinal: Soft and nontender. No distention. No abdominal bruits. No CVA tenderness. Genitourinary: deferred Musculoskeletal: No lower extremity tenderness nor edema.  No joint effusions. Neurologic:  Normal speech and language. No gross focal neurologic deficits are appreciated. No facial droop Skin:  Skin is warm, dry and intact. No rash noted.  ____________________________________________   LABS (all labs ordered are listed, but only abnormal results are displayed)  Results for orders placed or performed during the hospital encounter of 03/05/18 (from the past 24 hour(s))  Comprehensive metabolic panel     Status: Abnormal   Collection Time: 03/05/18  9:54 AM  Result Value Ref Range   Sodium 135 135 - 145 mmol/L   Potassium  4.0 3.5 - 5.1 mmol/L   Chloride 103 101 - 111 mmol/L   CO2 21 (L) 22 - 32 mmol/L   Glucose, Bld 129 (H) 65 - 99 mg/dL   BUN 26 (H) 6 - 20 mg/dL   Creatinine, Ser 0.90 0.44 - 1.00 mg/dL   Calcium 9.2 8.9 - 10.3 mg/dL   Total Protein 8.5 (H) 6.5 - 8.1 g/dL   Albumin 3.8 3.5 - 5.0 g/dL   AST 33 15 - 41 U/L   ALT 26 14 - 54 U/L   Alkaline Phosphatase 44 38 - 126 U/L   Total Bilirubin 1.4 (H) 0.3 - 1.2 mg/dL   GFR calc non Af Amer >60 >60 mL/min   GFR calc Af Amer >60 >60 mL/min   Anion gap 11 5 - 15  Urinalysis, Complete w Microscopic     Status: Abnormal   Collection Time: 03/05/18  9:54 AM  Result Value Ref Range   Color, Urine AMBER (A) YELLOW   APPearance HAZY (A) CLEAR   Specific Gravity, Urine 1.020 1.005 - 1.030   pH 5.0 5.0 - 8.0   Glucose, UA NEGATIVE NEGATIVE mg/dL   Hgb urine dipstick LARGE (A) NEGATIVE   Bilirubin Urine NEGATIVE NEGATIVE   Ketones, ur 5 (A) NEGATIVE mg/dL   Protein, ur 30 (A) NEGATIVE mg/dL   Nitrite POSITIVE (A) NEGATIVE   Leukocytes, UA MODERATE (A) NEGATIVE   RBC / HPF 11-20 0 - 5 RBC/hpf   WBC, UA >50 (H) 0 - 5 WBC/hpf   Bacteria, UA MANY (A) NONE SEEN   Squamous Epithelial / LPF 0-5 0 - 5   WBC Clumps PRESENT    Mucus PRESENT    Hyaline Casts, UA PRESENT   Urine Drug Screen, Qualitative ([facility] only)     Status: Abnormal   Collection Time: 03/05/18  9:54 AM  Result Value Ref Range   Tricyclic, Ur Screen NONE DETECTED NONE DETECTED   Amphetamines, Ur Screen NONE DETECTED NONE DETECTED   MDMA (Ecstasy)Ur Screen POSITIVE (A) NONE DETECTED   Cocaine Metabolite,Ur [place] NONE DETECTED NONE DETECTED   Opiate, Ur Screen NONE DETECTED NONE DETECTED   Phencyclidine (PCP) Ur S NONE DETECTED NONE DETECTED   Cannabinoid 50 Ng, Ur [place] NONE DETECTED NONE DETECTED   Barbiturates, Ur Screen NONE DETECTED NONE DETECTED   Benzodiazepine, Ur Scrn NONE DETECTED NONE DETECTED   Methadone Scn, Ur NONE DETECTED NONE DETECTED  Blood gas, venous     Status: Abnormal    Collection Time: 03/05/18  9:54 AM  Result Value Ref Range   pH, Ven 7.48 (H) 7.250 - 7.430   pCO2, Ven 33 (L) 44.0 - 60.0 mmHg   pO2, Ven 46.0 (H) 32.0 - 45.0 mmHg   Bicarbonate 24.6 20.0 - 28.0 mmol/L   Acid-Base Excess 1.7 0.0 - 2.0 mmol/L   O2 Saturation 85.1 %   Patient temperature 37.0    Collection site VEIN    Sample type VENOUS   CBC with Differential/Platelet     Status: Abnormal   Collection Time: 03/05/18  9:54 AM  Result Value Ref  Range   WBC 7.2 3.6 - 11.0 K/uL   RBC 5.26 (H) 3.80 - 5.20 MIL/uL   Hemoglobin 15.7 12.0 - 16.0 g/dL   HCT 46.1 35.0 - 47.0 %   MCV 87.6 80.0 - 100.0 fL   MCH 29.9 26.0 - 34.0 pg   MCHC 34.2 32.0 - 36.0 g/dL   RDW 13.9 11.5 - 14.5 %   Platelets 294 150 - 440 K/uL   Neutrophils Relative % 65 %   Neutro Abs 4.7 1.4 - 6.5 K/uL   Lymphocytes Relative 26 %   Lymphs Abs 1.9 1.0 - 3.6 K/uL   Monocytes Relative 6 %   Monocytes Absolute 0.5 0.2 - 0.9 K/uL   Eosinophils Relative 2 %   Eosinophils Absolute 0.2 0 - 0.7 K/uL   Basophils Relative 1 %   Basophils Absolute 0.1 0 - 0.1 K/uL  Glucose, capillary     Status: Abnormal   Collection Time: 03/05/18 12:51 PM  Result Value Ref Range   Glucose-Capillary 132 (H) 65 - 99 mg/dL   ____________________________________________  EKG My review and personal interpretation at Time: 9:47   Indication: ams  Rate: 60  Rhythm: sinus Axis: left Other: lbbb, no stemi ____________________________________________  RADIOLOGY  I personally reviewed all radiographic images ordered to evaluate for the above acute complaints and reviewed radiology reports and findings.  These findings were personally discussed with the patient.  Please see medical record for radiology report.  ____________________________________________   PROCEDURES  Procedure(s) performed:  Procedures    Critical Care performed: no ____________________________________________   INITIAL IMPRESSION / ASSESSMENT AND PLAN / ED  COURSE  Pertinent labs & imaging results that were available during my care of the patient were reviewed by me and considered in my medical decision making (see chart for details).  DDX: Dehydration, sepsis, pna, uti, hypoglycemia, cva, drug effect, withdrawal, encephalitis   Rebekah Warner is a 67 y.o. who presents to the ED with symptoms as described above.  Patient is clearly encephalopathic.  No focal neuro deficits seems more metabolic in nature.  CT imaging of the head will be ordered given her complex past medical history with previous neurosurgery.  Blood work will be sent for the above differentiatial.  The patient will be placed on continuous pulse oximetry and telemetry for monitoring.  Laboratory evaluation will be sent to evaluate for the above complaints.     Clinical Course as of Mar 05 1402  Wed Mar 05, 2018  1105 Patient with evidence of nitrite positive UTI which explain the patient's acute encephalopathy.  Will give IV Rocephin.  Based on her confusion do feel patient will require hospitalization for additional IV antibiotics and medical management.  Have discussed with the patient and available family all diagnostics and treatments performed thus far and all questions were answered to the best of my ability. The patient demonstrates understanding and agreement with plan.    [PR]    Clinical Course User Index [PR] Merlyn Lot, MD     As part of my medical decision making, I reviewed the following data within the [place] notes reviewed and incorporated, Labs reviewed, notes from prior ED visits and [place] Controlled Substance Database   ____________________________________________   FINAL CLINICAL IMPRESSION(S) / ED DIAGNOSES  Final diagnoses:  Acute cystitis with hematuria  Acute metabolic encephalopathy      NEW MEDICATIONS STARTED DURING THIS VISIT:  Current Discharge Medication List       Note:  This  document was prepared  using Systems analyst and may include unintentional dictation errors.    Merlyn Lot, MD 03/05/18 [phone]

## 2018-03-05 NOTE — Plan of Care (Signed)
Pt admitted today for new onset confusion.  Diagnosed w/UTI.  Pt is confused - had trouble coming up w/last name and year of birth.  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  Uses electric scooter at home and has a lot of pull up bars at home.  Purewick seemed to make her more agitated.  PT got her up to [facility], but she's also been incontinent.  No c/o pain.  Husband was opposed to sliding scale, but explained that sliding scale was tightest way to control hyperglycemia since we can't estimate how much she is going to eat and didn't want to give her glipizide which she takes at home, which might make her low.

## 2018-03-05 NOTE — ED Notes (Signed)
Attempted to call report to floor. Floor RN states unable to take report at this time

## 2018-03-05 NOTE — Evaluation (Signed)
Physical Therapy Evaluation Patient Details Name: Rebekah Warner MRN: [id number] DOB: 1951-02-05 Today's Date: 03/05/2018   History of Present Illness  Pt is a34 y.o.femalewith a known history of diabetes mellitus type 2, previous cerebellar stroke and brain surgery came in because of altered mental status. According to husband patient was confused for a couple of days. Pt also had poor p.o. intake and vomited. Patient found to have UTI. CT head done in the emergency room did not show acute stroke.  Assessment includes: confusionlikely from metabolic encephalopathy rather than acute stroke, acute UTI, DM II, and deconditioning.  Per family, pt also has a h/o muscular dystrophy.      Clinical Impression  Pt presents with deficits in strength, transfers, mobility, gait, balance, and activity tolerance.  Pt required min A for bed mobility tasks along with extra time and effort. Pt required CGA during transfer training from various surfaces with verbal cues for proper sequencing.  Pt was able to take several steps forward as well as sideways with a RW, CGA, and verbal cues for sequencing/safe use of the RW.  Family reports that pt has not had PT services for years and currently the pt uses grab bars for amb in the bathroom and HHA for amb in the bedroom but has a history of falls including > 10 falls in the last year.  Pt will benefit from HHPT services upon discharge to safely address above deficits for decreased fall risk and decreased caregiver assistance and eventual return to PLOF.      Follow Up Recommendations Home health PT;Supervision for mobility/OOB    Equipment Recommendations  None recommended by PT    Recommendations for Other Services       Precautions / Restrictions Precautions Precautions: Fall Restrictions Weight Bearing Restrictions: No      Mobility  Bed Mobility Overal bed mobility: Needs Assistance Bed Mobility: Supine to Sit;Sit to Supine     Supine to sit:  Min assist Sit to supine: Min assist      Transfers Overall transfer level: Needs assistance Equipment used: Rolling walker (2 wheeled) Transfers: Sit to/from Stand Sit to Stand: Min guard         General transfer comment: Min verbal cues for sequencing with good eccentric and concentric control.    Ambulation/Gait Ambulation/Gait assistance: Min guard Ambulation Distance (Feet): 4 Feet Assistive device: Rolling walker (2 wheeled) Gait Pattern/deviations: Step-to pattern;Trunk flexed Gait velocity: Decreased   General Gait Details: Mod verbal cues for sequencing with the RW with pt steady without LOB  Stairs            Wheelchair Mobility    Modified Rankin (Stroke Patients Only)       Balance Overall balance assessment: Mild deficits observed, not formally tested                                           Pertinent Vitals/Pain Pain Assessment: No/denies pain    Home Living Family/patient expects to be discharged to:: Private residence Living Arrangements: Children;Spouse/significant other Available Help at Discharge: Family;Available 24 hours/day Type of Home: House Home Access: Ramped entrance     Home Layout: Two level;Able to live on main level with bedroom/bathroom Home Equipment: Transport planner;Wheelchair - power;Walker - 2 wheels;Bedside commode;Grab bars - tub/shower      Prior Function Level of Independence: Needs assistance   Gait / Transfers Assistance  Needed: Pt mostly uses a motorized w/c in the home and an electric scooter in the community but does amb limited household distances with grab bars in the bathroom and with HHA in bedroom; >10 falls in the last year.  ADL's / Homemaking Assistance Needed: Assist from family with ADLs        Hand Dominance   Dominant Hand: Right    Extremity/Trunk Assessment   Upper Extremity Assessment Upper Extremity Assessment: Generalized weakness    Lower Extremity  Assessment Lower Extremity Assessment: Generalized weakness       Communication   Communication: No difficulties  Cognition Arousal/Alertness: Awake/alert Behavior During Therapy: Impulsive Overall Cognitive Status: History of cognitive impairments - at baseline                                        General Comments      Exercises Other Exercises Other Exercises: Transfer training to/from various surfaces with mod verbal cues for sequencing Other Exercises: Amb training 2 x 4' with a RW and with mod verbal cues for sequencing with the RW   Assessment/Plan    PT Assessment Patient needs continued PT services  PT Problem List Decreased strength;Decreased activity tolerance;Decreased balance;Decreased mobility, history of falling       PT Treatment Interventions DME instruction;Gait training;Functional mobility training;Balance training;Therapeutic exercise;Therapeutic activities;Patient/family education    PT Goals (Current goals can be found in the Care Plan section)  Acute Rehab PT Goals Patient Stated Goal: To get stronger PT Goal Formulation: With patient Time For Goal Achievement: 03/18/18 Potential to Achieve Goals: Good    Frequency Min 2X/week   Barriers to discharge        Co-evaluation               AM-PAC PT "6 Clicks" Daily Activity  Outcome Measure Difficulty turning over in bed (including adjusting bedclothes, sheets and blankets)?: Unable Difficulty moving from lying on back to sitting on the side of the bed? : Unable Difficulty sitting down on and standing up from a chair with arms (e.g., wheelchair, bedside commode, etc,.)?: Unable Help needed moving to and from a bed to chair (including a wheelchair)?: A Little Help needed walking in hospital room?: A Lot Help needed climbing 3-5 steps with a railing? : A Lot 6 Click Score: 10    End of Session Equipment Utilized During Treatment: Gait belt Activity Tolerance: Patient  tolerated treatment well Patient left: in bed;with call bell/phone within reach;with bed alarm set;with family/visitor present;with nursing/sitter in room Nurse Communication: Mobility status PT Visit Diagnosis: Muscle weakness (generalized) (M62.81);Difficulty in walking, not elsewhere classified (R26.2)    Time: 4268-3419 PT Time Calculation (min) (ACUTE ONLY): 29 min   Charges:   PT Evaluation $PT Eval Low Complexity: 1 Low PT Treatments $Therapeutic Activity: 8-22 mins   PT G Codes:        DRoyetta Asal PT, DPT 03/05/18, 4:12 PM

## 2018-03-05 NOTE — ED Notes (Signed)
First Nurse Note:  Patient to ED with husband.  Husband states patient has been increasingly confused and weak X 2 days, worse today.  States he thinks she may have a UTI.  Hx of previous "stroke and brain surgery".  Patient alert sitting in WC.

## 2018-03-05 NOTE — ED Triage Notes (Signed)
Pt arrived via POV with reports of confusion that started a couple of days ago per husband. Pt has hx of brain surgery and CVA.  Pt also states pt has hx of UTI with confusion, but never this bad.   Pt husband states pt has had decreased PO intake over the past few days.  Pt had difficulty remembering her birthday and is disoriented to time, place and situation.

## 2018-03-05 NOTE — Care Management Note (Signed)
Case Management Note  Patient Details  Name: Rebekah Warner MRN: [id number] Date of Birth: 1951/03/24  Subjective/Objective:  Admitted to [facility] with the diagnosis of altered mental status. Lives with husband, Delfino Lovett [phone]). Last seen Dr. Netty Starring April 2019. Mail order prescriptions or [facility]. No home health in the past. [facility] in [place] and locally in the past (2009). Self feed, needs help getting into shower and dressing. Rolling walker, motorized wheelchair, regular wheelchair, shower chair, and scooter in the home. Last fall was May 4th. Fair-good appetite. Family will transport.                  Action/Plan: Physical therapy evaluation completed. Recommending home with home health and physical therapy. Declining services at this time.  Expected Discharge Date:                  Expected Discharge Plan:     In-House Referral:     Discharge planning Services     Post Acute Care Choice:    Choice offered to:     DME Arranged:    DME Agency:     HH Arranged:    HH Agency:     Status of Service:     If discussed at [employer], dates discussed:    Additional Comments:  Shelbie Ammons, RN MSN CCM Care Management [phone] 03/05/2018, 3:01 PM

## 2018-03-05 NOTE — H&P (Signed)
[place] at [place] NAME: Rebekah Warner    MR#:  [id number]  DATE OF BIRTH:  Jan 08, 1951  DATE OF ADMISSION:  03/05/2018  PRIMARY CARE PHYSICIAN: Dion Body, MD   REQUESTING/REFERRING PHYSICIAN: Dr. Merlyn Lot  CHIEF COMPLAINT: Altered mental status   Chief Complaint  Patient presents with  . Altered Mental Status    HISTORY OF PRESENT ILLNESS:  Rebekah Warner  is a 67 y.o. female with a known history of diabetes mellitus type 2, previous cerebellar stroke came in because of altered mental status.  According to husband patient is confused for couple of days.  Also has poor p.o. intake for couple of days, vomited today.  Patient found to have UTI.  CT head done in the emergency room did not show acute stroke.  Patient is alert but slow to respond. Denies fever, abdominal pain, dysuria, frequent urination., PAST MEDICAL HISTORY:   Past Medical History:  Diagnosis Date  . Diabetes mellitus without complication ([place])   . Stroke [facility])     PAST SURGICAL HISTOIRY:   Past Surgical History:  Procedure Laterality Date  . BRAIN SURGERY      SOCIAL HISTORY:   Social History   Tobacco Use  . Smoking status: Never Smoker  . Smokeless tobacco: Never Used  Substance Use Topics  . Alcohol use: Not on file    FAMILY HISTORY:  History reviewed. No pertinent family history.  DRUG ALLERGIES:  No Known Allergies  REVIEW OF SYSTEMS:  CONSTITUTIONAL: No fever, fatigue or weakness.  EYES: No blurred or double vision.  EARS, NOSE, AND THROAT: No tinnitus or ear pain.  RESPIRATORY: No cough, shortness of breath, wheezing or hemoptysis.  CARDIOVASCULAR: No chest pain, orthopnea, edema.  GASTROINTESTINAL: Nausea, vomiting.  Today. GENITOURINARY: No dysuria, hematuria.  ENDOCRINE: No polyuria, nocturia,  HEMATOLOGY: No anemia, easy bruising or bleeding SKIN: No rash or lesion. MUSCULOSKELETAL: No joint pain or arthritis.    NEUROLOGIC: No tingling, numbness, weakness.  Patient is confused according to husband, having trouble remembering things. PSYCHIATRY: No anxiety or depression.   MEDICATIONS AT HOME:   Prior to Admission medications   Medication Sig Start Date End Date Taking? Authorizing Provider  buPROPion (WELLBUTRIN SR) 150 MG 12 hr tablet Take 1 tablet by mouth daily. 01/05/18  Yes [provider]  glimepiride (AMARYL) 1 MG tablet Take 1 tablet by mouth daily. 02/18/18  Yes [provider]  QUEtiapine (SEROQUEL) 100 MG tablet Take 1 tablet by mouth daily. 01/05/18  Yes [provider]  sertraline (ZOLOFT) 100 MG tablet Take 1 tablet by mouth daily. 01/05/18  Yes [provider]  traMADol (ULTRAM) 50 MG tablet Take 1 tablet by mouth every 4 (four) hours as needed. 03/01/18  Yes [provider]  zolpidem (AMBIEN) 5 MG tablet Take 1 tablet by mouth daily. 01/19/18  Yes [provider]      VITAL SIGNS:  Blood pressure 102/80, pulse 72, temperature 97.7 F (36.5 C), resp. rate 18, height 5\' 7"  (1.702 m), weight 74.4 kg (164 lb), SpO2 94 %.  PHYSICAL EXAMINATION:  GENERAL:  67 y.o.-year-old patient lying in the bed with no acute distress.  EYES: Pupils equal, round, reactive to light and accommodation. No scleral icterus. Extraocular muscles intact.  HEENT: Head atraumatic, normocephalic. Oropharynx and nasopharynx clear.  NECK:  Supple, no jugular venous distention. No thyroid enlargement, no tenderness.  LUNGS: Normal breath sounds bilaterally, no wheezing, rales,rhonchi or crepitation. No  use of accessory muscles of respiration.  CARDIOVASCULAR: S1, S2 normal. No murmurs, rubs, or gallops.  ABDOMEN: Soft, nontender, nondistended. Bowel sounds present. No organomegaly or mass.  EXTREMITIES: No pedal edema, cyanosis, or clubbing.  NEUROLOGIC: Cranial nerves II through XII are intact. Muscle strength 5/5 in all extremities. Sensation intact. Gait not  checked.  Noted to have generalized weakness but no focal weakness. PSYCHIATRIC: The patient is alert and oriented x 3.  Slow to respond looks like she is having difficulty getting the words out. SKIN: No obvious rash, lesion, or ulcer.   LABORATORY PANEL:   CBC Recent Labs  Lab 03/05/18 0954  WBC 7.2  HGB 15.7  HCT 46.1  PLT 294   ------------------------------------------------------------------------------------------------------------------  Chemistries  Recent Labs  Lab 03/05/18 0954  NA 135  K 4.0  CL 103  CO2 21*  GLUCOSE 129*  BUN 26*  CREATININE 0.90  CALCIUM 9.2  AST 33  ALT 26  ALKPHOS 44  BILITOT 1.4*   ------------------------------------------------------------------------------------------------------------------  Cardiac Enzymes No results for input(s): TROPONINI in the last 168 hours. ------------------------------------------------------------------------------------------------------------------  RADIOLOGY:  Ct Head Wo Contrast  Result Date: 03/05/2018 CLINICAL DATA:  Altered level of consciousness. EXAM: CT HEAD WITHOUT CONTRAST TECHNIQUE: Contiguous axial images were obtained from the base of the skull through the vertex without intravenous contrast. COMPARISON:  None. FINDINGS: Brain: Postoperative changes in the left cerebellum with encephalomalacia. There is atrophy and chronic small vessel disease changes. No acute intracranial abnormality. Specifically, no hemorrhage, hydrocephalus, mass lesion, acute infarction, or significant intracranial injury. Vascular: No hyperdense vessel or unexpected calcification. Skull: No acute calvarial abnormality. Prior craniotomy in the left occipital region. Sinuses/Orbits: No acute findings Other: None IMPRESSION: Postoperative changes in the left occipital region with encephalomalacia in the left cerebellum. Atrophy, chronic small vessel disease. No acute intracranial abnormality. Electronically Signed   By:  Rolm Baptise M.D.   On: 03/05/2018 10:45   Dg Chest Portable 1 View  Result Date: 03/05/2018 CLINICAL DATA:  Altered mental status EXAM: PORTABLE CHEST 1 VIEW COMPARISON:  None. FINDINGS: Heart and mediastinal contours are within normal limits. No focal opacities or effusions. No acute bony abnormality. IMPRESSION: No active disease. Electronically Signed   By: Rolm Baptise M.D.   On: 03/05/2018 10:11    EKG:   Orders placed or performed during the hospital encounter of 03/05/18  . ED EKG  . ED EKG    IMPRESSION AND PLAN:   67 year old female patient with history of left cerebellar stroke before, previous brain surgery, diabetes mellitus type 2 brought in by husband for confusion  1.confusion likely from metabolic encephalopathy rather than acute stroke.  CT head done in the emergency room did not show acute stroke, she had postoperative changes ,left occipital lobe with encephalomalacia in the left cerebellum. #2 acute UTI: Started on Rocephin, follow urine cultures, 3.  Dehydration, gentle hydration 4.  Diabetes mellitus type 2, nauseous, poor p.o. intake for the last 2 days, hold glyburide, start sliding scale insulin with coverage, resume limited when patient feels better and has good p.o. intake. 5.  Deconditioning, physical therapy consult.  All the records are reviewed and case discussed with ED provider. Management plans discussed with the patient, family and they are in agreement.  CODE STATUS: Full code  TOTAL TIME TAKING CARE OF THIS PATIENT: 16minutes.  disCussed with husband, patient.  [name] M.D on 03/05/2018 at 11:47 AM  Between 7am to 6pm - Pager - [phone]  After 6pm go  to [email or web address] - password [email or web address] [facility]  Office  [phone]  CC: Primary care physician; Dion Body, MD  Note: This dictation was prepared with Dragon dictation along with smaller phrase technology. Any transcriptional errors that result  from this process are unintentional.

## 2018-03-06 DIAGNOSIS — E119 Type 2 diabetes mellitus without complications: Secondary | ICD-10-CM | POA: Diagnosis not present

## 2018-03-06 DIAGNOSIS — N390 Urinary tract infection, site not specified: Secondary | ICD-10-CM | POA: Diagnosis not present

## 2018-03-06 DIAGNOSIS — R4182 Altered mental status, unspecified: Secondary | ICD-10-CM | POA: Diagnosis not present

## 2018-03-06 LAB — BASIC METABOLIC PANEL
Anion gap: 10 (ref 5–15)
BUN: 16 mg/dL (ref 6–20)
CO2: 23 mmol/L (ref 22–32)
Calcium: 8.6 mg/dL — ABNORMAL LOW (ref 8.9–10.3)
Chloride: 103 mmol/L (ref 101–111)
Creatinine, Ser: 0.7 mg/dL (ref 0.44–1.00)
GFR calc Af Amer: >60 mL/min (ref >60)
GLUCOSE: 132 mg/dL — AB (ref 65–99)
Potassium: 3.5 mmol/L (ref 3.5–5.1)
Sodium: 136 mmol/L (ref 135–145)

## 2018-03-06 LAB — GLUCOSE, CAPILLARY
GLUCOSE-CAPILLARY: 127 mg/dL — AB (ref 65–99)
GLUCOSE-CAPILLARY: 117 mg/dL — AB (ref 65–99)
Glucose-Capillary: 111 mg/dL — ABNORMAL HIGH (ref 65–99)
Glucose-Capillary: 105 mg/dL — ABNORMAL HIGH (ref 65–99)

## 2018-03-06 LAB — CBC
HCT: 44 % (ref 35.0–47.0)
Hemoglobin: 15 g/dL (ref 12.0–16.0)
MCH: 29.9 pg (ref 26.0–34.0)
MCHC: 34 g/dL (ref 32.0–36.0)
MCV: 87.9 fL (ref 80.0–100.0)
Platelets: 271 10*3/uL (ref 150–440)
RBC: 5.01 MIL/uL (ref 3.80–5.20)
RDW: 13.5 % (ref 11.5–14.5)
WBC: 5.6 10*3/uL (ref 3.6–11.0)

## 2018-03-06 LAB — HIV ANTIBODY (ROUTINE TESTING W REFLEX): HIV Screen 4th Generation wRfx: NONREACTIVE

## 2018-03-06 NOTE — Progress Notes (Signed)
Physical Therapy Treatment Patient Details Name: Rebekah Warner MRN: [id number] DOB: [birth year] Today's Date: 03/06/2018    History of Present Illness 67 y.o.femalewith a history of diabetes mellitus type 2, previous cerebellar stroke and brain surgery came in because of altered mental status. According to husband patient was confused for a couple of days. Pt also had poor p.o. intake and vomited. Patient found to have UTI. CT head done in the emergency room did not show acute stroke.  Assessment includes: confusionlikely from metabolic encephalopathy rather than acute stroke, acute UTI, DM II, and deconditioning.  Per family, pt also has a h/o muscular dystrophy.      PT Comments    Pt showed good effort and relative strength with limited in-bed exercises but did not at all want to get up to sitting or try any mobility/standing activities.  She reports she does little out of the w/c at home and does not feel like working on this today.  Pt did show decent quality of motion and tolerated light resistance with most supine exercises.  Pt indicated that she is not interested in HHPT on discharge, did encourage her to try and stay as active as possible at home with assist and not only be in the bed or w/c all the time.   Follow Up Recommendations  Home health PT;Supervision for mobility/OOB     Equipment Recommendations  None recommended by PT    Recommendations for Other Services       Precautions / Restrictions Precautions Precautions: Fall Restrictions Weight Bearing Restrictions: No    Mobility  Bed Mobility               General bed mobility comments: pt deferred any mobility/sitting/EOB activities  Transfers                    Ambulation/Gait                 Stairs             Wheelchair Mobility    Modified Rankin (Stroke Patients Only)       Balance                                            Cognition  Arousal/Alertness: Awake/alert Behavior During Therapy: Flat affect Overall Cognitive Status: History of cognitive impairments - at baseline                                        Exercises General Exercises - Lower Extremity Ankle Circles/Pumps: AROM;10 reps;Both Heel Slides: Strengthening;AROM;10 reps;Both Hip ABduction/ADduction: Strengthening;AROM;10 reps;Both Straight Leg Raises: AROM;10 reps;Both    General Comments        Pertinent Vitals/Pain Pain Assessment: No/denies pain    Home Living                      Prior Function            PT Goals (current goals can now be found in the care plan section) Progress towards PT goals: Progressing toward goals    Frequency    Min 2X/week      PT Plan Current plan remains appropriate    Co-evaluation  AM-PAC PT "6 Clicks" Daily Activity  Outcome Measure  Difficulty turning over in bed (including adjusting bedclothes, sheets and blankets)?: Unable Difficulty moving from lying on back to sitting on the side of the bed? : Unable Difficulty sitting down on and standing up from a chair with arms (e.g., wheelchair, bedside commode, etc,.)?: Unable Help needed moving to and from a bed to chair (including a wheelchair)?: A Little Help needed walking in hospital room?: A Lot Help needed climbing 3-5 steps with a railing? : A Lot 6 Click Score: 10    End of Session Equipment Utilized During Treatment: Gait belt Activity Tolerance: Patient tolerated treatment well Patient left: in bed;with call bell/phone within reach;with bed alarm set;with family/visitor present Nurse Communication: Mobility status PT Visit Diagnosis: Muscle weakness (generalized) (M62.81);Difficulty in walking, not elsewhere classified (R26.2)     Time: 8264-1583 PT Time Calculation (min) (ACUTE ONLY): 17 min  Charges:  $Therapeutic Exercise: 8-22 mins                        Kreg Shropshire,  DPT 03/06/2018, 4:00 PM

## 2018-03-06 NOTE — Care Management Obs Status (Signed)
[facility] NOTIFICATION   Patient Details  Name: Rebekah Warner MRN: [id number] Date of Birth: 1950/11/17   Medicare Observation Status Notification Given:  Yes    Dael Howland A, RN 03/06/2018, 3:31 PM

## 2018-03-06 NOTE — Progress Notes (Signed)
[place] at [place] NAME: Rebekah Warner    MR#:  [id number]  DATE OF BIRTH:  1951/03/14  SUBJECTIVE:admitted for AMS/UTI,  CHIEF COMPLAINT:   Chief Complaint  Patient presents with  . Altered Mental Status    REVIEW OF SYSTEMS:   ROS CONSTITUTIONAL: No fever, fatigue or weakness.  EYES: No blurred or double vision.  EARS, NOSE, AND THROAT: No tinnitus or ear pain.  RESPIRATORY: No cough, shortness of breath, wheezing or hemoptysis.  CARDIOVASCULAR: No chest pain, orthopnea, edema.  GASTROINTESTINAL: No nausea, vomiting, diarrhea or abdominal pain.  GENITOURINARY: No dysuria, hematuria.  ENDOCRINE: No polyuria, nocturia,  HEMATOLOGY: No anemia, easy bruising or bleeding SKIN: No rash or lesion. MUSCULOSKELETAL: No joint pain or arthritis.   NEUROLOGIC: No tingling, numbness, weakness.  PSYCHIATRY: No anxiety or depression.   DRUG ALLERGIES:  No Known Allergies  VITALS:  Blood pressure 102/64, pulse 86, temperature 97.6 F (36.4 C), temperature source Oral, resp. rate 16, height 5\' 7"  (1.702 m), weight 77.5 kg (170 lb 13.7 oz), SpO2 97 %.  PHYSICAL EXAMINATION:  GENERAL:  67 y.o.-year-old patient lying in the bed with no acute distress.  EYES: Pupils equal, round, reactive to light and accommodation. No scleral icterus. Extraocular muscles intact.  HEENT: Head atraumatic, normocephalic. Oropharynx and nasopharynx clear.  NECK:  Supple, no jugular venous distention. No thyroid enlargement, no tenderness.  LUNGS: Normal breath sounds bilaterally, no wheezing, rales,rhonchi or crepitation. No use of accessory muscles of respiration.  CARDIOVASCULAR: S1, S2 normal. No murmurs, rubs, or gallops.  ABDOMEN: Soft, nontender, nondistended. Bowel sounds present. No organomegaly or mass.  EXTREMITIES: No pedal edema, cyanosis, or clubbing.  NEUROLOGIC: Cranial nerves II through XII are intact. Muscle strength 5/5 in all extremities.  Sensation intact. Gait not checked.  PSYCHIATRIC: The patient is alert and oriented x 3.  SKIN: No obvious rash, lesion, or ulcer.    LABORATORY PANEL:   CBC Recent Labs  Lab 03/06/18 0457  WBC 5.6  HGB 15.0  HCT 44.0  PLT 271   ------------------------------------------------------------------------------------------------------------------  Chemistries  Recent Labs  Lab 03/05/18 0954 03/06/18 0457  NA 135 136  K 4.0 3.5  CL 103 103  CO2 21* 23  GLUCOSE 129* 132*  BUN 26* 16  CREATININE 0.90 0.70  CALCIUM 9.2 8.6*  AST 33  --   ALT 26  --   ALKPHOS 44  --   BILITOT 1.4*  --    ------------------------------------------------------------------------------------------------------------------  Cardiac Enzymes No results for input(s): TROPONINI in the last 168 hours. ------------------------------------------------------------------------------------------------------------------  RADIOLOGY:  Ct Head Wo Contrast  Result Date: 03/05/2018 CLINICAL DATA:  Altered level of consciousness. EXAM: CT HEAD WITHOUT CONTRAST TECHNIQUE: Contiguous axial images were obtained from the base of the skull through the vertex without intravenous contrast. COMPARISON:  None. FINDINGS: Brain: Postoperative changes in the left cerebellum with encephalomalacia. There is atrophy and chronic small vessel disease changes. No acute intracranial abnormality. Specifically, no hemorrhage, hydrocephalus, mass lesion, acute infarction, or significant intracranial injury. Vascular: No hyperdense vessel or unexpected calcification. Skull: No acute calvarial abnormality. Prior craniotomy in the left occipital region. Sinuses/Orbits: No acute findings Other: None IMPRESSION: Postoperative changes in the left occipital region with encephalomalacia in the left cerebellum. Atrophy, chronic small vessel disease. No acute intracranial abnormality. Electronically Signed   By: Rolm Baptise M.D.   On: 03/05/2018 10:45    Dg Chest Portable 1 View  Result Date: 03/05/2018 CLINICAL DATA:  Altered mental status EXAM: PORTABLE CHEST 1 VIEW COMPARISON:  None. FINDINGS: Heart and mediastinal contours are within normal limits. No focal opacities or effusions. No acute bony abnormality. IMPRESSION: No active disease. Electronically Signed   By: Rolm Baptise M.D.   On: 03/05/2018 10:11    EKG:   Orders placed or performed during the hospital encounter of 03/05/18  . ED EKG  . ED EKG    ASSESSMENT AND PLAN:   67 year old female patient with history of left cerebellar stroke before, previous brain surgery, diabetes mellitus type 2 brought in by husband for confusion  1.confusion likely from metabolic encephalopathy rather than acute stroke.  CT head done in the emergency room did not show acute stroke, she had postoperative changes ,left occipital lobe with encephalomalacia in the left cerebellum. #2 acute UTI: Started on Rocephin, follow urine cultures, 3.  Dehydration, gentle hydration 4.  Diabetes mellitus type 2, nauseous, poor p.o. intake for the last 2 days, hold glyburide, start sliding scale insulin with coverage, resume  AMrayl when patient feels better and has good p.o. intake. 5.  Deconditioning, physical therapy consult.        All the records are reviewed and case discussed with Care Management/Social Workerr. Management plans discussed with the patient, family and they are in agreement.  CODE STATUS: full  TOTAL TIME TAKING CARE OF THIS PATIENT;35:  minutes.   POSSIBLE D/C IN 1-2DAYS, DEPENDING ON CLINICAL CONDITION.   [name] M.D on 03/06/2018 at 1:54 PM  Between 7am to 6pm - Pager - [phone]  After 6pm go to [email or web address] - password [email or web address] [facility]  Office  [phone]  CC: Primary care physician; Dion Body, MD   Note: This dictation was prepared with Dragon dictation along with smaller phrase technology. Any transcriptional  errors that result from this process are unintentional.

## 2018-03-07 DIAGNOSIS — E119 Type 2 diabetes mellitus without complications: Secondary | ICD-10-CM | POA: Diagnosis not present

## 2018-03-07 DIAGNOSIS — N390 Urinary tract infection, site not specified: Secondary | ICD-10-CM | POA: Diagnosis not present

## 2018-03-07 DIAGNOSIS — R4182 Altered mental status, unspecified: Secondary | ICD-10-CM | POA: Diagnosis not present

## 2018-03-07 LAB — HIV ANTIBODY (ROUTINE TESTING W REFLEX): HIV Screen 4th Generation wRfx: NONREACTIVE

## 2018-03-07 LAB — GLUCOSE, CAPILLARY
GLUCOSE-CAPILLARY: 103 mg/dL — AB (ref 65–99)
GLUCOSE-CAPILLARY: 108 mg/dL — AB (ref 65–99)
Glucose-Capillary: 96 mg/dL (ref 65–99)

## 2018-03-07 MED ORDER — TRAMADOL HCL 50 MG PO TABS: 50.0000 mg | ORAL_TABLET | ORAL | Status: DC | PRN

## 2018-03-07 NOTE — Care Management CC44 (Signed)
Condition Code 44 Documentation Completed  Patient Details  Name: Rebekah Warner MRN: [id number] Date of Birth: January 27, 1951   Condition Code 44 given:  Yes Patient signature on Condition Code 44 notice:  Yes Documentation of 2 MD's agreement:  Yes Code 44 added to claim:  Yes    Marshell Garfinkel, RN 03/07/2018, 12:46 PM

## 2018-03-07 NOTE — Progress Notes (Signed)
[place] at [place] NAME: Rebekah Warner    MR#:  [id number]  DATE OF BIRTH:  1951/01/27  SUBJECTIVE:admitted for AMS/UTI, urine cultures showing gram-negative rods, final results are pending.  But sheappears more alert and coherent on admission.  CHIEF COMPLAINT:   Chief Complaint  Patient presents with  . Altered Mental Status  disCussed with husband.  Patient has chronic hiccups.  REVIEW OF SYSTEMS:   ROS CONSTITUTIONAL: No fever, fatigue or weakness.  EYES: No blurred or double vision.  EARS, NOSE, AND THROAT: No tinnitus or ear pain.  RESPIRATORY: No cough, shortness of breath, wheezing or hemoptysis.  CARDIOVASCULAR: No chest pain, orthopnea, edema.  GASTROINTESTINAL: No nausea, vomiting, diarrhea or abdominal pain.  GENITOURINARY: No dysuria, hematuria.  ENDOCRINE: No polyuria, nocturia,  HEMATOLOGY: No anemia, easy bruising or bleeding SKIN: No rash or lesion. MUSCULOSKELETAL: No joint pain or arthritis.   NEUROLOGIC: No tingling, numbness, weakness.  PSYCHIATRY: No anxiety or depression.   DRUG ALLERGIES:  No Known Allergies  VITALS:  Blood pressure 135/79, pulse 84, temperature 97.8 F (36.6 C), temperature source Oral, resp. rate 17, height 5\' 7"  (1.702 m), weight 77.7 kg (171 lb 4.8 oz), SpO2 95 %.  PHYSICAL EXAMINATION:  GENERAL:  67 y.o.-year-old patient lying in the bed with no acute distress.  EYES: Pupils equal, round, reactive to light and accommodation. No scleral icterus. Extraocular muscles intact.  HEENT: Head atraumatic, normocephalic. Oropharynx and nasopharynx clear.  NECK:  Supple, no jugular venous distention. No thyroid enlargement, no tenderness.  LUNGS: Normal breath sounds bilaterally, no wheezing, rales,rhonchi or crepitation. No use of accessory muscles of respiration.  CARDIOVASCULAR: S1, S2 normal. No murmurs, rubs, or gallops.  ABDOMEN: Soft, nontender, nondistended. Bowel sounds present.  No organomegaly or mass.  EXTREMITIES: No pedal edema, cyanosis, or clubbing.  NEUROLOGIC: Cranial nerves II through XII are intact. Muscle strength 5/5 in all extremities. Sensation intact. Gait not checked.  PSYCHIATRIC: The patient is alert and oriented x 3.  SKIN: No obvious rash, lesion, or ulcer.    LABORATORY PANEL:   CBC Recent Labs  Lab 03/06/18 0457  WBC 5.6  HGB 15.0  HCT 44.0  PLT 271   ------------------------------------------------------------------------------------------------------------------  Chemistries  Recent Labs  Lab 03/05/18 0954 03/06/18 0457  NA 135 136  K 4.0 3.5  CL 103 103  CO2 21* 23  GLUCOSE 129* 132*  BUN 26* 16  CREATININE 0.90 0.70  CALCIUM 9.2 8.6*  AST 33  --   ALT 26  --   ALKPHOS 44  --   BILITOT 1.4*  --    ------------------------------------------------------------------------------------------------------------------  Cardiac Enzymes No results for input(s): TROPONINI in the last 168 hours. ------------------------------------------------------------------------------------------------------------------  RADIOLOGY:  No results found.  EKG:   Orders placed or performed during the hospital encounter of 03/05/18  . ED EKG  . ED EKG    ASSESSMENT AND PLAN:   67 year old female patient with history of left cerebellar stroke before, previous brain surgery, diabetes mellitus type 2 brought in by husband for confusion  1.confusion likely from metabolic encephalopathy rather than acute stroke.  CT head done in the emergency room did not show acute stroke, she had postoperative changes ,left occipital lobe with encephalomalacia in the left cerebellum.  #2 acute UTI: Urine cultures are showing gram-negative rods, final sensitivity results are pending.  Started on Rocephin, follow urine cultures,, patient encephalopathy improving, almost back to baseline, discussed with husband he says that she  is much better than when she  came.  3.  Dehydration,: Improved, discontinue IV fluids today.  4.  Diabetes mellitus type 2, p.o. intake is better, restart Amaryl, at discharge, continue sliding scale with coverage.  5.  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  Arrange home health physical therapy.  Discussed with husband, husband is mildly primary caregiver at home.      All the records are reviewed and case discussed with Care Management/Social Workerr. Management plans discussed with the patient, family and they are in agreement.  CODE STATUS: full  TOTAL TIME TAKING CARE OF THIS PATIENT;35:  minutes.   POSSIBLE D/C IN 1-2DAYS, DEPENDING ON CLINICAL CONDITION.   [name] M.D on 03/07/2018 at 12:15 PM  Between 7am to 6pm - Pager - [phone]  After 6pm go to [email or web address] - password [email or web address] [facility]  Office  [phone]  CC: Primary care physician; Dion Body, MD   Note: This dictation was prepared with Dragon dictation along with smaller phrase technology. Any transcriptional errors that result from this process are unintentional.

## 2018-03-08 ENCOUNTER — Observation Stay: Payer: Medicare HMO

## 2018-03-08 DIAGNOSIS — G9389 Other specified disorders of brain: Secondary | ICD-10-CM | POA: Diagnosis not present

## 2018-03-08 DIAGNOSIS — R4182 Altered mental status, unspecified: Secondary | ICD-10-CM | POA: Diagnosis not present

## 2018-03-08 DIAGNOSIS — N390 Urinary tract infection, site not specified: Secondary | ICD-10-CM | POA: Diagnosis not present

## 2018-03-08 LAB — GLUCOSE, CAPILLARY
GLUCOSE-CAPILLARY: 99 mg/dL (ref 65–99)
GLUCOSE-CAPILLARY: 110 mg/dL — AB (ref 65–99)
Glucose-Capillary: 115 mg/dL — ABNORMAL HIGH (ref 65–99)
Glucose-Capillary: 107 mg/dL — ABNORMAL HIGH (ref 65–99)

## 2018-03-08 LAB — URINE CULTURE

## 2018-03-08 MED ORDER — SODIUM CHLORIDE 0.9 % IV SOLN
INTRAVENOUS | Status: DC
  Administered 2018-03-08 – 2018-03-09 (×2): via INTRAVENOUS

## 2018-03-08 NOTE — Progress Notes (Signed)
PT Cancellation Note  Patient Details Name: Rebekah Warner MRN: [id number] DOB: Jun 02, 1951   Cancelled Treatment:    Reason Eval/Treat Not Completed: Patient declined, no reason specified   Pt offered and encouraged session this am.  Pt awake in bed but declined session.  Reviewed importance of mobility but she remained steady in her refusal.   Chesley Noon 03/08/2018, 11:26 AM

## 2018-03-08 NOTE — Progress Notes (Signed)
[facility] NAME: Rebekah Warner    MR#:  [id number]  DATE OF BIRTH:  [birth year]  SUBJECTIVE:admitted for AMS/UTI, urine culture showed E. coli.  Has been mentioned that she is more lethargic today.  Patient appears more lethargic today.  Able to follow commands but slightly weaker on the right side which is not much changed from before but we will get MRI of the brain due to past neurological history.  CHIEF COMPLAINT:   Chief Complaint  Patient presents with  . Altered Mental Status  disCussed with husband.  Patient has chronic hiccups.  REVIEW OF SYSTEMS:   ROS  CONSTITUTIONAL: No fever, sightly more lethargic, slow to respond today. EYES: No blurred or double vision.  EARS, NOSE, AND THROAT: No tinnitus or ear pain.  RESPIRATORY: No cough, shortness of breath, wheezing or hemoptysis.  CARDIOVASCULAR: No chest pain, orthopnea, edema.  GASTROINTESTINAL: No nausea, vomiting, diarrhea or abdominal pain.  GENITOURINARY: No dysuria, hematuria.  ENDOCRINE: No polyuria, nocturia,  HEMATOLOGY: No anemia, easy bruising or bleeding SKIN: No rash or lesion. MUSCULOSKELETAL: No joint pain or arthritis.   NEUROLOGIC: No tingling, numbness, weakness.  PSYCHIATRY: No anxiety or depression.   DRUG ALLERGIES:  No Known Allergies  VITALS:  Blood pressure (!) 149/82, pulse 73, temperature 97.8 F (36.6 C), temperature source Oral, resp. rate 18, height 5\' 7"  (1.702 m), weight 77.7 kg (171 lb 4.8 oz), SpO2 93 %.  PHYSICAL EXAMINATION:  GENERAL:  67 y.o.-year-old patient lying in the bed with no acute distress.  EYES: Pupils equal, round, reactive to light and accommodation. No scleral icterus. Extraocular muscles intact.  HEENT: Head atraumatic, normocephalic. Oropharynx and nasopharynx clear.  NECK:  Supple, no jugular venous distention. No thyroid enlargement, no tenderness.  LUNGS: Normal breath sounds bilaterally, no wheezing,  rales,rhonchi or crepitation. No use of accessory muscles of respiration.  CARDIOVASCULAR: S1, S2 normal. No murmurs, rubs, or gallops.  ABDOMEN: Soft, nontender, nondistended. Bowel sounds present. No organomegaly or mass.  EXTREMITIES: No pedal edema, cyanosis, or clubbing.  NEUROLOGIC: Cranial nerves II through XII are intact. Muscle strength 5/5 in all extremities. Sensation intact. Gait not checked.  PSYCHIATRIC: The patient is alert and oriented x 3.  SKIN: No obvious rash, lesion, or ulcer.    LABORATORY PANEL:   CBC Recent Labs  Lab 03/06/18 0457  WBC 5.6  HGB 15.0  HCT 44.0  PLT 271   ------------------------------------------------------------------------------------------------------------------  Chemistries  Recent Labs  Lab 03/05/18 0954 03/06/18 0457  NA 135 136  K 4.0 3.5  CL 103 103  CO2 21* 23  GLUCOSE 129* 132*  BUN 26* 16  CREATININE 0.90 0.70  CALCIUM 9.2 8.6*  AST 33  --   ALT 26  --   ALKPHOS 44  --   BILITOT 1.4*  --    ------------------------------------------------------------------------------------------------------------------  Cardiac Enzymes No results for input(s): TROPONINI in the last 168 hours. ------------------------------------------------------------------------------------------------------------------  RADIOLOGY:  No results found.  EKG:   Orders placed or performed during the hospital encounter of 03/05/18  . ED EKG  . ED EKG    ASSESSMENT AND PLAN:   67 year old female patient with history of left cerebellar stroke before, previous brain surgery, diabetes mellitus type 2 brought in by husband for confusion  1.confusion likely from metabolic encephalopathy rather than acute stroke.  CT head done in the emergency room did not show acute stroke, she had postoperative changes ,left occipital lobe with encephalomalacia  in the left cerebellum. More lethargic today as per husband, get MRI of the brain.  #2 a E. coli  UTI, sensitive to Cipro.,  Patient has recurrent UTIs, needs repeat urine cultures after the  finishing treatment to see if she continues to be on chronic suppressive therapy.   3.  Dehydration,: Improved, discontinue IV fluids yesterday, because of her lethargy restart today.  .  4.  Diabetes mellitus type 2, p.o. intake is better, restart Amaryl, at discharge, continue sliding scale with coverage.  5.  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  Arrange home health physical therapy.  Discussed with husband, husband is  The main primary caregiver at home.      All the records are reviewed and case discussed with Care Management/Social Workerr. Management plans discussed with the patient, family and they are in agreement.  CODE STATUS: full  TOTAL TIME TAKING CARE OF THIS PATIENT;35:  minutes.   POSSIBLE D/C IN 1-2DAYS, DEPENDING ON CLINICAL CONDITION.   [name] M.D on 03/08/2018 at 11:52 AM  Between 7am to 6pm - Pager - [phone]  After 6pm go to [email or web address] - password [email or web address] [facility]  Office  [phone]  CC: Primary care physician; Dion Body, MD   Note: This dictation was prepared with Dragon dictation along with smaller phrase technology. Any transcriptional errors that result from this process are unintentional.

## 2018-03-09 DIAGNOSIS — N390 Urinary tract infection, site not specified: Secondary | ICD-10-CM | POA: Diagnosis not present

## 2018-03-09 DIAGNOSIS — R4182 Altered mental status, unspecified: Secondary | ICD-10-CM | POA: Diagnosis not present

## 2018-03-09 LAB — GLUCOSE, CAPILLARY: Glucose-Capillary: 114 mg/dL — ABNORMAL HIGH (ref 65–99)

## 2018-03-09 MED ORDER — CIPROFLOXACIN HCL 500 MG PO TABS: 500.0000 mg | ORAL_TABLET | Freq: Two times a day (BID) | ORAL | 0 refills | Status: AC

## 2018-03-09 NOTE — Progress Notes (Signed)
MRI of the brain results discussed with husband told that there is no acute infarct showed only chronic cerebellar encephalomalacia on the left side due to previous surgeries.  Patient does have UTI with E. coli and sensitive to Cipro so discharging with Cipro 500 mg twice daily for 10 days.  Patient needs repeat urine cultures, follow-up with PCP in 1 week to see if the UTI resolved if not patient needs repeat urine cultures and may be chronic suppressive antibiotic therapy because of her recurrent UTIs, .

## 2018-03-09 NOTE — Discharge Summary (Signed)
Rebekah Warner, is a 67 y.o. female  DOB [birth year]  MRN [id number].  Admission date:  03/05/2018  Admitting Physician  [name], MD  Discharge Date:  03/09/2018   Primary MD  Dion Body, MD  Recommendations for primary care physician for things to follow:   Follow-up with PCP in 1 week   Admission Diagnosis  Acute cystitis with hematuria [K44.01] Acute metabolic encephalopathy [U27.25]   Discharge Diagnosis  Acute cystitis with hematuria [D66.44] Acute metabolic encephalopathy [I34.74]    Active Problems:   UTI (urinary tract infection)      Past Medical History:  Diagnosis Date  . Diabetes mellitus without complication ([place])   . Stroke [facility])     Past Surgical History:  Procedure Laterality Date  . BRAIN SURGERY         History of present illness and  Hospital Course:     Kindly see H&P for history of present illness and admission details, please review complete Labs, Consult reports and Test reports for all details in brief  HPI  from the history and physical done on the day of admission 67 year old female with history of diabetes mellitus type 2, previous stroke, history of present disease, status post brain surgery before came in because of altered mental status, trouble speaking.  Patient found to have UTI and admitted for same.   Hospital Course  #1 metabolic encephalopathy secondary to UTI.  Patient had CT of head did not show acute stroke.  Patient metabolic encephalopathy improved with IV fluids, treatment of UTI.  Also had MRI of the brain which showed no acute stroke.  MRI of the brain showed extensive left cerebellar encephalomalacia in the setting of prior surgery.  2.  UTI with E. coli: Patient urine culture or showed more than 100,000 colonies of E. coli resistant to ampicillin  but sensitive to Rocephin, Cipro.  Patient received Rocephin in the hospital.  Discharged home with Cipro 500 mg twice daily for 10 days, advised the patient has been to see PCP after the treatment to see if patient UTI is resolved otherwise she needs repeat urine cultures. 3.  Major depression 4.  Myoclonic dystrophy, patient has history of previous cerebellar stroke, brain surgery. Patient does little out of wheelchair at home anyway.  Physical therapy recommended home health physical therapy, patient indicated that she is not interested in home health physical therapy.  #Discharge Condition: stable   Follow UP  Follow-up Information    Dion Body, MD. Schedule an appointment as soon as possible for a visit in 1 week.   Specialty:  Family Medicine Why:  Office Closed Contact information: [address] [place] [address] [phone]             Discharge Instructions  and  Discharge Medications      Allergies as of 03/09/2018   No Known Allergies     Medication List    TAKE these medications   buPROPion 150 MG 12 hr tablet Commonly known as:  WELLBUTRIN SR Take 1 tablet by mouth daily.   ciprofloxacin 500 MG tablet Commonly known as:  CIPRO Take 1 tablet (500 mg total) by mouth 2 (two) times daily for 10 days.   glimepiride 1 MG tablet Commonly known as:  AMARYL Take 1 tablet by mouth daily.   QUEtiapine 100 MG tablet Commonly known as:  SEROQUEL Take 1 tablet by mouth daily.   sertraline 100 MG tablet Commonly known as:  ZOLOFT  Take 1 tablet by mouth daily.   traMADol 50 MG tablet Commonly known as:  ULTRAM Take 1 tablet by mouth every 4 (four) hours as needed.   zolpidem 5 MG tablet Commonly known as:  AMBIEN Take 1 tablet by mouth daily.       plandiscussed with husband  Diet and Activity recommendation: See Discharge Instructions above   Consults obtained -physical therapy   Major procedures and  Radiology Reports - PLEASE review detailed and final reports for all details, in brief -      Ct Head Wo Contrast  Result Date: 03/05/2018 CLINICAL DATA:  Altered level of consciousness. EXAM: CT HEAD WITHOUT CONTRAST TECHNIQUE: Contiguous axial images were obtained from the base of the skull through the vertex without intravenous contrast. COMPARISON:  None. FINDINGS: Brain: Postoperative changes in the left cerebellum with encephalomalacia. There is atrophy and chronic small vessel disease changes. No acute intracranial abnormality. Specifically, no hemorrhage, hydrocephalus, mass lesion, acute infarction, or significant intracranial injury. Vascular: No hyperdense vessel or unexpected calcification. Skull: No acute calvarial abnormality. Prior craniotomy in the left occipital region. Sinuses/Orbits: No acute findings Other: None IMPRESSION: Postoperative changes in the left occipital region with encephalomalacia in the left cerebellum. Atrophy, chronic small vessel disease. No acute intracranial abnormality. Electronically Signed   By: Rolm Baptise M.D.   On: 03/05/2018 10:45   Mr Brain Wo Contrast  Result Date: 03/08/2018 CLINICAL DATA:  Altered mental status. EXAM: MRI HEAD WITHOUT CONTRAST TECHNIQUE: Multiplanar, multiecho pulse sequences of the brain and surrounding structures were obtained without intravenous contrast. COMPARISON:  Head CT 03/05/2018 and MRI 01/31/2010 FINDINGS: Brain: There is no evidence of acute infarct, intracranial hemorrhage, mass, midline shift, or extra-axial fluid collection. Extensive encephalomalacia is again seen in the left cerebellum with overlying craniectomy changes. Cerebellar volume loss has progressed from 2011. Cerebral atrophy is mildly advanced for age and has also progressed from 2011. Scattered small foci of T2 hyperintensity in the cerebral white matter have progressed and are nonspecific but compatible with mild chronic small vessel ischemic disease. A  small area of linear gliosis in the right frontal lobe may be related to previous ventriculostomy catheter placement. 2 subcentimeter chronic infarcts in the right cerebellum are new from 2011. Vascular: Major intracranial vascular flow voids are preserved. Skull and upper cervical spine: No suspicious marrow lesion. Left retromastoid craniectomy. Sinuses/Orbits: Bilateral cataract extraction. Trace left sphenoid sinus fluid. Trace right mastoid fluid. Other: Chronically thickened bone/exostoses from the petrous temporal bones as previously seen. IMPRESSION: 1. No acute intracranial abnormality. 2. Extensive left cerebellar encephalomalacia in the setting of prior surgery. 3. Mild chronic small vessel ischemic disease, progressed from 2011 with interval chronic right cerebellar infarcts. Electronically Signed   By: Logan Bores M.D.   On: 03/08/2018 15:49   Dg Chest Portable 1 View  Result Date: 03/05/2018 CLINICAL DATA:  Altered mental status EXAM: PORTABLE CHEST 1 VIEW COMPARISON:  None. FINDINGS: Heart and mediastinal contours are within normal limits. No focal opacities or effusions. No acute bony abnormality. IMPRESSION: No active disease. Electronically Signed   By: Rolm Baptise M.D.   On: 03/05/2018 10:11    Micro Results     Recent Results (from the past 240 hour(s))  Urine Culture     Status: Abnormal   Collection Time: 03/05/18  9:54 AM  Result Value Ref Range Status   Specimen Description   Final    URINE, RANDOM Performed at [facility], [address] [place],  [place] [address]    Special Requests   Final    NONE Performed at [facility], [address] [place], [place] [address]    Culture >=100,000 COLONIES/mL ESCHERICHIA COLI (A)  Final   Report Status 03/08/2018 FINAL  Final   Organism ID, Bacteria ESCHERICHIA COLI (A)  Final      Susceptibility   Escherichia coli - MIC*    AMPICILLIN >=32 RESISTANT Resistant     CEFAZOLIN <=4 SENSITIVE Sensitive      CEFTRIAXONE <=1 SENSITIVE Sensitive     CIPROFLOXACIN <=0.25 SENSITIVE Sensitive     GENTAMICIN >=16 RESISTANT Resistant     IMIPENEM <=0.25 SENSITIVE Sensitive     NITROFURANTOIN <=16 SENSITIVE Sensitive     TRIMETH/SULFA <=20 SENSITIVE Sensitive     AMPICILLIN/SULBACTAM 16 INTERMEDIATE Intermediate     PIP/TAZO <=4 SENSITIVE Sensitive     Extended ESBL NEGATIVE Sensitive     * >=100,000 COLONIES/mL ESCHERICHIA COLI       Today   Subjective:   Rebekah Warner today has no headache,no chest abdominal pain,no new weakness tingling or numbness, feels much better wants to go home today.  Objective:   Blood pressure 124/80, pulse 80, temperature 97.8 F (36.6 C), temperature source Oral, resp. rate 17, height 5\' 7"  (1.702 m), weight 79.4 kg (175 lb 0.7 oz), SpO2 92 %.   Intake/Output Summary (Last 24 hours) at 03/09/2018 1256 Last data filed at 03/09/2018 0900 Gross per 24 hour  Intake 450 ml  Output -  Net 450 ml    Exam Awake Alert, Oriented x 3, No new F.N deficits, Normal affect [place].AT,PERRAL Supple Neck,No JVD, No cervical lymphadenopathy appriciated.  Symmetrical Chest wall movement, Good air movement bilaterally, CTAB RRR,No Gallops,Rubs or new Murmurs, No Parasternal Heave +ve B.Sounds, Abd Soft, Non tender, No organomegaly appriciated, No rebound -guarding or rigidity. No Cyanosis, Clubbing or edema, No new Rash or bruise  Data Review   CBC w Diff:  Lab Results  Component Value Date   WBC 5.6 03/06/2018   HGB 15.0 03/06/2018   HCT 44.0 03/06/2018   PLT 271 03/06/2018   LYMPHOPCT 26 03/05/2018   MONOPCT 6 03/05/2018   EOSPCT 2 03/05/2018   BASOPCT 1 03/05/2018    CMP:  Lab Results  Component Value Date   NA 136 03/06/2018   K 3.5 03/06/2018   CL 103 03/06/2018   CO2 23 03/06/2018   BUN 16 03/06/2018   CREATININE 0.70 03/06/2018   PROT 8.5 (H) 03/05/2018   ALBUMIN 3.8 03/05/2018   BILITOT 1.4 (H) 03/05/2018   ALKPHOS 44 03/05/2018   AST 33  03/05/2018   ALT 26 03/05/2018  .   Total Time in preparing paper work, data evaluation and todays exam - 78 minutes  [name] M.D on 03/09/2018 at 12:56 PM    Note: This dictation was prepared with Dragon dictation along with smaller phrase technology. Any transcriptional errors that result from this process are unintentional.

## 2018-03-10 ENCOUNTER — Encounter: Payer: Self-pay | Admitting: Cardiovascular Disease

## 2018-03-15 ENCOUNTER — Other Ambulatory Visit: Payer: Self-pay

## 2018-03-15 ENCOUNTER — Emergency Department: Payer: Medicare HMO

## 2018-03-15 ENCOUNTER — Encounter: Payer: Self-pay | Admitting: Emergency Medicine

## 2018-03-15 ENCOUNTER — Emergency Department
Admission: EM | Admit: 2018-03-15 | Discharge: 2018-03-16 | Disposition: A | Discharge status: Home / Self Care | Payer: Medicare HMO | Attending: Emergency Medicine | Admitting: Emergency Medicine

## 2018-03-15 DIAGNOSIS — R109 Unspecified abdominal pain: Secondary | ICD-10-CM | POA: Diagnosis not present

## 2018-03-15 DIAGNOSIS — J9811 Atelectasis: Secondary | ICD-10-CM | POA: Diagnosis not present

## 2018-03-15 DIAGNOSIS — E860 Dehydration: Secondary | ICD-10-CM | POA: Diagnosis not present

## 2018-03-15 DIAGNOSIS — R531 Weakness: Secondary | ICD-10-CM | POA: Diagnosis not present

## 2018-03-15 DIAGNOSIS — R4182 Altered mental status, unspecified: Secondary | ICD-10-CM | POA: Insufficient documentation

## 2018-03-15 DIAGNOSIS — R1030 Lower abdominal pain, unspecified: Secondary | ICD-10-CM | POA: Insufficient documentation

## 2018-03-15 DIAGNOSIS — R197 Diarrhea, unspecified: Secondary | ICD-10-CM | POA: Diagnosis not present

## 2018-03-15 LAB — COMPREHENSIVE METABOLIC PANEL
ALK PHOS: 52 U/L (ref 38–126)
ALT: 46 U/L (ref 14–54)
AST: 43 U/L — AB (ref 15–41)
Albumin: 3.8 g/dL (ref 3.5–5.0)
Anion gap: 9 (ref 5–15)
BUN: 19 mg/dL (ref 6–20)
CALCIUM: 9.9 mg/dL (ref 8.9–10.3)
CHLORIDE: 100 mmol/L — AB (ref 101–111)
CO2: 26 mmol/L (ref 22–32)
CREATININE: 0.66 mg/dL (ref 0.44–1.00)
GFR calc Af Amer: >60 mL/min (ref >60)
GFR calc non Af Amer: >60 mL/min (ref >60)
GLUCOSE: 117 mg/dL — AB (ref 65–99)
Potassium: 3.8 mmol/L (ref 3.5–5.1)
SODIUM: 135 mmol/L (ref 135–145)
Total Bilirubin: 0.8 mg/dL (ref 0.3–1.2)
Total Protein: 8.3 g/dL — ABNORMAL HIGH (ref 6.5–8.1)

## 2018-03-15 LAB — CBC
HCT: 45.4 % (ref 35.0–47.0)
Hemoglobin: 15.6 g/dL (ref 12.0–16.0)
MCH: 29.7 pg (ref 26.0–34.0)
MCHC: 34.4 g/dL (ref 32.0–36.0)
MCV: 86.4 fL (ref 80.0–100.0)
PLATELETS: 379 10*3/uL (ref 150–440)
RBC: 5.26 MIL/uL — AB (ref 3.80–5.20)
RDW: 13.6 % (ref 11.5–14.5)
WBC: 6.5 10*3/uL (ref 3.6–11.0)

## 2018-03-15 LAB — URINALYSIS, COMPLETE (UACMP) WITH MICROSCOPIC
Bilirubin Urine: NEGATIVE
Glucose, UA: NEGATIVE mg/dL
Hgb urine dipstick: NEGATIVE
Ketones, ur: 5 mg/dL — AB
LEUKOCYTES UA: NEGATIVE
Nitrite: NEGATIVE
Protein, ur: NEGATIVE mg/dL
SPECIFIC GRAVITY, URINE: 1.018 (ref 1.005–1.030)
pH: 5 (ref 5.0–8.0)

## 2018-03-15 LAB — TROPONIN I
TROPONIN I: 0.04 ng/mL — AB (ref <0.03)
Troponin I: 0.03 ng/mL (ref <0.03)

## 2018-03-15 MED ORDER — SODIUM CHLORIDE 0.9 % IV BOLUS
1000.0000 mL | Freq: Once | INTRAVENOUS | Status: AC
  Administered 2018-03-15: 1000 mL via INTRAVENOUS

## 2018-03-15 MED ORDER — ONDANSETRON HCL 4 MG/2ML IJ SOLN
4.0000 mg | Freq: Once | INTRAMUSCULAR | Status: AC
  Administered 2018-03-15: 4 mg via INTRAVENOUS
  Filled 2018-03-15: qty 2

## 2018-03-15 MED ORDER — IOHEXOL 300 MG/ML  SOLN
100.0000 mL | Freq: Once | INTRAMUSCULAR | Status: AC | PRN
  Administered 2018-03-15: 100 mL via INTRAVENOUS

## 2018-03-15 MED ORDER — IOPAMIDOL (ISOVUE-300) INJECTION 61%: 100.0000 mL | Freq: Once | INTRAVENOUS | Status: DC | PRN

## 2018-03-15 NOTE — ED Triage Notes (Signed)
First Nurse Note:  Arrives with spouse who states patient has been confused since being discharged from hospital 5 days ago.  Patient was hospitalized for a UTI and is currently taking antibiotics.

## 2018-03-15 NOTE — ED Triage Notes (Signed)
Confusion and weakness x 1 1/2 weeks per husband. States has been treated for UTI during this. Had admission during this time and had MRI which husband states was normal. Patient states she "feels miserable" but cant elaborate what that means. Does remember she was admitted to hospital for similar symptoms. Smile symmetrical. Grips equal. Has history of MD and gets around at home with scooter.

## 2018-03-15 NOTE — ED Provider Notes (Signed)
[facility] Emergency Department Provider Note  ____________________________________________  Time seen: Approximately 7:57 PM  I have reviewed the triage vital signs and the nursing notes.   HISTORY  Chief Complaint Weakness and Altered Mental Status  Level 5 Caveat: Portions of the History and Physical are unable to be obtained due to patient being a poor historian   HPI Rebekah Warner is a 67 y.o. female with a history of depression diabetes dyslipidemia IBS and stroke  and muscular dystrophy has brought to the ED today for confusion and generalized weakness.  History is somewhat limited because the patient does not provide any clear description of her symptoms and her husband provides confusing conflicting accounts.  He notes that she has chronic generalized weakness which is progressively worsening due to her muscular dystrophy condition.  About 2 weeks ago, the patient started becoming confused and when he came to the hospital with her she was diagnosed with a urinary tract infection and hospitalized.  She had an MRI performed on June 1, and upon reviewing the electronic medical record I see that this was negative for any acute pathology but did show chronic prior strokes in the right cerebellum and encephalomalacia of the left cerebellum.  Husband confirms that the patient is unable to walk since her brain surgery.  She was treated in the hospital for the UTI.  Discharged on Cipro, but he reports she was physically unable to swallow even when it was crushed, so they called the PCP who changed her to [facility].  I reviewed electronic medical record and the urine culture results does show that the UTI should be sensitive to Macrobid.  She has 2 days left worth of the Macrobid.  Her confusion has not improved.  She was evaluated in hospital for by physical therapy who recommended home health and PT, but the husband states that he told them not to bother her because he  does not think physical therapy will help her.  Weakness is constant without aggravating or relieving factors.  No vomiting or diarrhea.  Patient does report she has no appetite and has not been eating or drinking for the past week.   Past Medical History:  Diagnosis Date  . Depression   . Diabetes mellitus without complication ([place])   . Dyslipidemia   . First degree AV block   . History of CVA (cerebrovascular accident)   . IBS (irritable bowel syndrome)   . Major depressive disorder, recurrent episode, in full remission ([place])   . Major depressive disorder, recurrent episode, mild ([place])   . Myotonic dystrophy, type 1 ([place])   . OSA (obstructive sleep apnea)   . Personal history of Paget's disease of bone   . Posttraumatic stress disorder   . Recurrent major depression in full remission ([place])   . Sinus bradycardia   . Stroke [facility])      Patient Active Problem List   Diagnosis Date Noted  . UTI (urinary tract infection) 03/05/2018  . Severe episode of recurrent major depressive disorder, without psychotic features ([place]) 05/09/2016  . Vaccine counseling 03/22/2016  . Ovarian mass, right 07/20/2015  . Adnexal pain 06/27/2015  . Abdominal pain, right lower quadrant 06/27/2015  . Neurosis, posttraumatic 02/11/2015  . Depression, major, recurrent, mild ([place]) 02/11/2015  . Depression, major, recurrent, in complete remission ([place]) 02/11/2015  . Diabetes type 2, controlled ([place]) 10/18/2014  . Acute cerebellar ataxia ([place]) 09/29/2014  . Cerebellar ataxia ([place]) 09/29/2014  . Insomnia, persistent 08/11/2014  .  Diabetes mellitus, type 2 ([place]) 08/11/2014  . Borderline diabetes mellitus 08/11/2014  . Fatigue 04/26/2014  . Adiposity 04/26/2014  . Vision disturbance 04/26/2014  . Unspecified visual disturbance 04/26/2014  . Hyperlipidemia 11/02/2013  . Muscular dystrophy, myotonic ([place]) 11/02/2013  . CVA (cerebral vascular accident) ([place]) 11/02/2013  . Cerebral artery occlusion with  cerebral infarction ([place]) 11/02/2013  . Bradycardia 10/16/2013  . First degree atrioventricular block 09/25/2013  . 1st degree AV block 09/25/2013  . Cardiac arrhythmia 10/30/2012  . Bradycardia, sinus 10/30/2012  . Depressive disorder 10/10/2012  . Transient ischemic attack (TIA), and cerebral infarction without residual deficits 10/10/2012  . Irritable colon 10/10/2012  . Obstructive sleep apnea 10/10/2012  . Osteitis deformans 10/10/2012  . Shortness of breath 10/10/2012  . Clinical depression 10/10/2012  . Cerebrovascular accident, old 10/10/2012  . Dyslipidemia 10/10/2012  . Obstructive apnea 10/10/2012  . Recurrent major depression in remission ([place]) 10/10/2012  . Myotonic muscular dystrophy ([place]) 09/19/2012  . 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 Myotonic dystrophy ([place]) 09/19/2012     Past Surgical History:  Procedure Laterality Date  . BRAIN SURGERY    . brain surgery on Paget's    . CATARACT EXTRACTION    . COLONOSCOPY    . RETINAL DETACHMENT SURGERY    . TOTAL ABDOMINAL HYSTERECTOMY       Prior to Admission medications   Medication Sig Start Date End Date Taking? Authorizing Provider  buPROPion (WELLBUTRIN SR) 150 MG 12 hr tablet Take 1 tablet (150 mg total) by mouth every morning. 11/14/17  Yes Clapacs, Madie Reno, MD  ciprofloxacin (CIPRO) 500 MG tablet Take 1 tablet (500 mg total) by mouth 2 (two) times daily for 10 days. 03/09/18 03/19/18 Yes [name], MD  glimepiride (AMARYL) 1 MG tablet Take 1 tablet by mouth daily. 02/18/18  Yes [provider]  naproxen sodium (ALEVE) 220 MG tablet Take 220 mg by mouth daily as needed.   Yes [provider]  nitrofurantoin, macrocrystal-monohydrate, (MACROBID) 100 MG capsule Take 100 mg by mouth 2 (two) times daily. 03/10/18  Yes [provider]  QUEtiapine (SEROQUEL) 100 MG tablet Take 1 tablet (100 mg total) by mouth at bedtime. 11/14/17  Yes Clapacs, Madie Reno, MD  Red Yeast Rice  Extract (RED YEAST RICE PO) Take by mouth daily.   Yes [provider]  sertraline (ZOLOFT) 100 MG tablet Take 1 tablet (100 mg total) by mouth daily. 11/14/17  Yes Clapacs, Madie Reno, MD  traMADol (ULTRAM) 50 MG tablet Take 1 tablet by mouth every 4 (four) hours as needed. 03/01/18  Yes [provider]  zolpidem (AMBIEN) 5 MG tablet Take 1 tablet (5 mg total) by mouth at bedtime as needed. for sleep 11/14/17  Yes Clapacs, Madie Reno, MD  albuterol (PROAIR HFA) 108 (90 BASE) MCG/ACT inhaler Inhale into the lungs. 12/03/14 01/08/18  [provider]  econazole nitrate 1 % cream Apply topically daily. Apply to affected area in groin 03/16/18   Carrie Mew, MD  megestrol (MEGACE ES) 625 MG/5ML suspension Take 5 mLs (625 mg total) by mouth daily. 03/16/18   Carrie Mew, MD  metoCLOPramide (REGLAN) 10 MG tablet Take 1 tablet (10 mg total) by mouth every 6 (six) hours as needed. 03/16/18   Carrie Mew, MD  NON FORMULARY CPAP    [provider]     Allergies Other; Actos  [pioglitazone]; and Metformin   Family History  Adopted: Yes  Family history unknown: Yes    Social  History Social History   Tobacco Use  . Smoking status: Never Smoker  . Smokeless tobacco: Never Used  Substance Use Topics  . Alcohol use: Never    Frequency: Never  . Drug use: Never    Review of Systems  Constitutional:   No fever or chills.  ENT:   No sore throat. No rhinorrhea. Cardiovascular:   No chest pain or syncope. Respiratory:   No dyspnea or cough. Gastrointestinal:   Negative for abdominal pain, vomiting and diarrhea.  Musculoskeletal:   Negative for focal pain or swelling All other systems reviewed and are negative except as documented above in ROS and HPI.  ____________________________________________   PHYSICAL EXAM:  VITAL SIGNS: ED Triage Vitals  Enc Vitals Group     BP 03/15/18 1450 125/73     Pulse Rate 03/15/18 1450 69     Resp 03/15/18 1450 20      Temp 03/15/18 1450 98.6 F (37 C)     Temp Source 03/15/18 1450 Oral     SpO2 03/15/18 1450 94 %     Weight 03/15/18 1451 160 lb (72.6 kg)     Height 03/15/18 1451 5' 7.5" (1.715 m)     Head Circumference --      Peak Flow --      Pain Score 03/15/18 1451 0     Pain Loc --      Pain Edu? --      Excl. in [place]? --     Vital signs reviewed, nursing assessments reviewed.   Constitutional:   Alert and oriented. Non-toxic appearance. Eyes:   Conjunctivae are normal. EOMI. PERRL. ENT      Head:   Normocephalic and atraumatic.      Nose:   No congestion/rhinnorhea.       Mouth/Throat:   Dry mucous membranes, no pharyngeal erythema. No peritonsillar mass.       Neck:   No meningismus. Full ROM. Hematological/Lymphatic/Immunilogical:   No cervical lymphadenopathy. Cardiovascular:   RRR. Symmetric bilateral radial and DP pulses.  No murmurs.  Respiratory:   Normal respiratory effort without tachypnea/retractions. Breath sounds are clear and equal bilaterally. No wheezes/rales/rhonchi. Gastrointestinal:   Soft with right lower quadrant tenderness, positive Rovsing sign. Non distended. There is no CVA tenderness.  No rebound, rigidity, or guarding.  Musculoskeletal:   Normal range of motion in all extremities. No joint effusions.  No lower extremity tenderness.  No edema. Neurologic:   Normal speech and language.  Motor grossly intact. No acute focal neurologic deficits are appreciated.  Skin:    Skin is warm, dry and intact. No rash noted.  No petechiae, purpura, or bullae.  ____________________________________________    LABS (pertinent positives/negatives) (all labs ordered are listed, but only abnormal results are displayed) Labs Reviewed  CBC - Abnormal; Notable for the following components:      Result Value   RBC 5.26 (*)    All other components within normal limits  URINALYSIS, COMPLETE (UACMP) WITH MICROSCOPIC - Abnormal; Notable for the following components:   Color, Urine  AMBER (*)    APPearance CLOUDY (*)    Ketones, ur 5 (*)    Bacteria, UA RARE (*)    All other components within normal limits  TROPONIN I - Abnormal; Notable for the following components:   Troponin I 0.04 (*)    All other components within normal limits  COMPREHENSIVE METABOLIC PANEL - Abnormal; Notable for the following components:   Chloride 100 (*)  Glucose, Bld 117 (*)    Total Protein 8.3 (*)    AST 43 (*)    All other components within normal limits  TROPONIN I - Abnormal; Notable for the following components:   Troponin I 0.03 (*)    All other components within normal limits  URINE CULTURE   ____________________________________________   EKG  Interpreted by me Normal sinus rhythm rate of 69, left axis, left bundle branch block.  No acute ischemic changes.  ____________________________________________    [id number]  Dg Chest 2 View  Result Date: 03/15/2018 CLINICAL DATA:  Progressive weakness. Acute metabolic encephalopathy. Recent discharge from hospital. EXAM: CHEST - 2 VIEW COMPARISON:  None. FINDINGS: Heart size is normal. Lung volumes are low. Areas of linear atelectasis are present in the right upper lobe. Lungs are otherwise clear. There is no edema or effusion. No other focal airspace disease is present. The visualized soft tissues and bony thorax are unremarkable. IMPRESSION: 1. Low lung volumes and mild linear atelectasis involving the right upper lobe 2. No other acute cardiopulmonary disease. Electronically Signed   By: San Morelle M.D.   On: 03/15/2018 16:25   Ct Abdomen Pelvis W Contrast  Result Date: 03/15/2018 CLINICAL DATA:  Lower abdominal pain, diarrhea, history of diabetes mellitus, type I myotonic dystrophy, stroke EXAM: CT ABDOMEN AND PELVIS WITH CONTRAST TECHNIQUE: Multidetector CT imaging of the abdomen and pelvis was performed using the standard protocol following bolus administration of intravenous contrast. Sagittal and coronal MPR  images reconstructed from axial data set. CONTRAST:  185mL OMNIPAQUE IOHEXOL 300 MG/ML SOLN IV. No oral contrast. COMPARISON:  None FINDINGS: Lower chest: Minimal dependent bibasilar atelectasis. Hepatobiliary: Minimal fatty infiltration of liver. Gallbladder and liver otherwise unremarkable. Pancreas: Normal appearance Spleen: Normal appearance Adrenals/Urinary Tract: Adrenal glands normal appearance. Multiple BILATERAL renal cysts largest anterior RIGHT kidney 3.4 x 2.6 cm image 37. Large calculus upper pole RIGHT kidney 15 x 9 mm. No additional renal mass, hydronephrosis or hydroureter. Bladder well distended without mass. Linear soft tissue extending cranially from the anterior superior aspect of the urinary bladder likely a urachal tract remnant. No bladder mass. Stomach/Bowel: Appendix surgically absent by history. Stomach and bowel loops normal appearance Vascular/Lymphatic: Atherosclerotic calcifications aorta without aneurysm. No adenopathy. Reproductive: Uterus surgically absent. Nonvisualization of LEFT ovary. Low-attenuation nodule identified posterior to the urinary bladder, 3.6 x 3.4 x 3.5 cm, slightly higher attenuation than the urine within the urinary bladder, uncertain if represents RIGHT ovary with a cystic lesion, cyst related to vaginal cuff, or less likely bladder diverticulum. Other: No free air or free fluid. No hernia or inflammatory process. Musculoskeletal: No acute osseous findings.  Bones demineralized. IMPRESSION: No acute intra-abdominal or intrapelvic process. 3.6 x 3.4 x 3.5 cm diameter mass posterior to the urinary bladder at the apex of the vaginal cuff on RIGHT, uncertain if represents RIGHT ovary, cyst/mass related to the vaginal cuff and prior hysterectomy, or less likely a bladder diverticulum. Recommend characterization by transvaginal sonography. BILATERAL renal cysts and nonobstructing 15 x 9 mm RIGHT renal calculus. Probable urachal tract remnant extending cranially from  the urinary bladder. Electronically Signed   By: Lavonia Dana M.D.   On: 03/15/2018 23:16    ____________________________________________   PROCEDURES Procedures  ____________________________________________  DIFFERENTIAL DIAGNOSIS   Appendicitis, bowel obstruction, colitis, persistent cystitis, dehydration  CLINICAL IMPRESSION / ASSESSMENT AND PLAN / ED COURSE  Pertinent labs & imaging results that were available during my care of the patient were reviewed by me and considered  in my medical decision making (see chart for details).    Patient presents with generalized weakness, malaise, reported confusion.  She is nonambulatory at baseline.  She is currently recovering from a urinary tract infection and taking antibiotics.  Her current symptoms are congruent with the expected course of her recent illness, but she is brought back to the ED for evaluation given the failure to improve yet.  She is found to have tenderness in the right lower quadrant of the abdomen.  Labs are unremarkable.  I will recheck her urinalysis.  If diagnostic of a UTI she will need to be rehospitalized.  If negative, I will obtain a CT scan of the abdomen and pelvis to evaluate for appendicitis or other abdominal pathology.  Clinical Course as of Mar 17 23  Sat Mar 15, 2018  2109 Ua negative. Will CT a/p.    [PS]    Clinical Course User Index [PS] Carrie Mew, MD     ----------------------------------------- 12:22 AM on 03/16/2018 -----------------------------------------  CT scan negative.  It redemonstrates the known 3 cm right adnexal mass.  Vital signs remain normal.  Patient remains awake and alert oriented and lucid.  At this point her presentation appears to be due to essentially failure to thrive from lack of appetite related to her chronic illness.  Husband does note that in the past she has even required tube feeding for up to 3 months at a time.  I will treat with Megace for appetite  stimulation.  Spectazole for her vaginal candidiasis after recent antibiotics.  Reglan to help with nausea and appetite improvement as well.  Advised to follow-up with her primary care doctor in 2 days, return to ED if worsening.  No evidence of any occult infection  ____________________________________________   FINAL CLINICAL IMPRESSION(S) / ED DIAGNOSES    Final diagnoses:  Dehydration  Generalized weakness     ED Discharge Orders        Ordered    econazole nitrate 1 % cream  Daily     03/16/18 0021    metoCLOPramide (REGLAN) 10 MG tablet  Every 6 hours PRN     03/16/18 0021    megestrol (MEGACE ES) 625 MG/5ML suspension  Daily     03/16/18 0021      Portions of this note were generated with dragon dictation software. Dictation errors may occur despite best attempts at proofreading.    Carrie Mew, MD 03/16/18 [phone]

## 2018-03-15 NOTE — ED Triage Notes (Signed)
Urine specimen deferred to patient being roomed due to mobility issues and patient does not think can obtain specimen on toilet.

## 2018-03-15 NOTE — ED Notes (Signed)
This tech completed I&O cath on pt, redness and irritation noticed to vagina as well as rectum, RN Becky Sax) called in pt room and access area and MD Joni Fears made aware as well, pt provided warm blanket and call bell within pt reach, female friend at pt bedside at this time

## 2018-03-16 MED ORDER — ECONAZOLE NITRATE 1 % EX CREA: TOPICAL_CREAM | Freq: Every day | CUTANEOUS | 1 refills | Status: AC

## 2018-03-16 MED ORDER — METOCLOPRAMIDE HCL 10 MG PO TABS: 10.0000 mg | ORAL_TABLET | Freq: Four times a day (QID) | ORAL | 0 refills | Status: AC | PRN

## 2018-03-16 MED ORDER — NITROFURANTOIN MONOHYD MACRO 100 MG PO CAPS
100.0000 mg | ORAL_CAPSULE | Freq: Once | ORAL | Status: AC
  Administered 2018-03-16: 100 mg via ORAL
  Filled 2018-03-16: qty 1

## 2018-03-16 MED ORDER — MEGESTROL ACETATE 625 MG/5ML PO SUSP: 625.0000 mg | Freq: Every day | ORAL | 0 refills | Status: AC

## 2018-03-18 LAB — URINE CULTURE

## 2018-03-19 DIAGNOSIS — R627 Adult failure to thrive: Secondary | ICD-10-CM | POA: Diagnosis not present

## 2018-03-19 DIAGNOSIS — E782 Mixed hyperlipidemia: Secondary | ICD-10-CM | POA: Diagnosis not present

## 2018-03-19 DIAGNOSIS — F3340 Major depressive disorder, recurrent, in remission, unspecified: Secondary | ICD-10-CM | POA: Diagnosis not present

## 2018-03-19 DIAGNOSIS — E119 Type 2 diabetes mellitus without complications: Secondary | ICD-10-CM | POA: Diagnosis not present

## 2018-03-19 DIAGNOSIS — R112 Nausea with vomiting, unspecified: Secondary | ICD-10-CM | POA: Diagnosis not present

## 2018-03-31 DIAGNOSIS — Z87440 Personal history of urinary (tract) infections: Secondary | ICD-10-CM | POA: Diagnosis not present

## 2018-04-03 DIAGNOSIS — R627 Adult failure to thrive: Secondary | ICD-10-CM | POA: Diagnosis not present

## 2018-04-03 DIAGNOSIS — E119 Type 2 diabetes mellitus without complications: Secondary | ICD-10-CM | POA: Diagnosis not present

## 2018-04-03 DIAGNOSIS — F339 Major depressive disorder, recurrent, unspecified: Secondary | ICD-10-CM | POA: Diagnosis not present

## 2018-04-03 DIAGNOSIS — G7111 Myotonic muscular dystrophy: Secondary | ICD-10-CM | POA: Diagnosis not present

## 2018-04-03 DIAGNOSIS — M1990 Unspecified osteoarthritis, unspecified site: Secondary | ICD-10-CM | POA: Diagnosis not present

## 2018-04-03 DIAGNOSIS — G4733 Obstructive sleep apnea (adult) (pediatric): Secondary | ICD-10-CM | POA: Diagnosis not present

## 2018-04-03 DIAGNOSIS — E785 Hyperlipidemia, unspecified: Secondary | ICD-10-CM | POA: Diagnosis not present

## 2018-04-03 DIAGNOSIS — M8888 Osteitis deformans of other bones: Secondary | ICD-10-CM | POA: Diagnosis not present

## 2018-04-03 DIAGNOSIS — K589 Irritable bowel syndrome without diarrhea: Secondary | ICD-10-CM | POA: Diagnosis not present

## 2018-04-14 DIAGNOSIS — M8888 Osteitis deformans of other bones: Secondary | ICD-10-CM | POA: Diagnosis not present

## 2018-04-14 DIAGNOSIS — K589 Irritable bowel syndrome without diarrhea: Secondary | ICD-10-CM | POA: Diagnosis not present

## 2018-04-14 DIAGNOSIS — G7111 Myotonic muscular dystrophy: Secondary | ICD-10-CM | POA: Diagnosis not present

## 2018-04-14 DIAGNOSIS — M1990 Unspecified osteoarthritis, unspecified site: Secondary | ICD-10-CM | POA: Diagnosis not present

## 2018-04-14 DIAGNOSIS — G4733 Obstructive sleep apnea (adult) (pediatric): Secondary | ICD-10-CM | POA: Diagnosis not present

## 2018-04-14 DIAGNOSIS — F339 Major depressive disorder, recurrent, unspecified: Secondary | ICD-10-CM | POA: Diagnosis not present

## 2018-04-14 DIAGNOSIS — R627 Adult failure to thrive: Secondary | ICD-10-CM | POA: Diagnosis not present

## 2018-04-14 DIAGNOSIS — E119 Type 2 diabetes mellitus without complications: Secondary | ICD-10-CM | POA: Diagnosis not present

## 2018-04-18 DIAGNOSIS — M1990 Unspecified osteoarthritis, unspecified site: Secondary | ICD-10-CM | POA: Diagnosis not present

## 2018-04-18 DIAGNOSIS — M8888 Osteitis deformans of other bones: Secondary | ICD-10-CM | POA: Diagnosis not present

## 2018-04-18 DIAGNOSIS — K589 Irritable bowel syndrome without diarrhea: Secondary | ICD-10-CM | POA: Diagnosis not present

## 2018-04-18 DIAGNOSIS — E119 Type 2 diabetes mellitus without complications: Secondary | ICD-10-CM | POA: Diagnosis not present

## 2018-04-18 DIAGNOSIS — G7111 Myotonic muscular dystrophy: Secondary | ICD-10-CM | POA: Diagnosis not present

## 2018-04-18 DIAGNOSIS — E785 Hyperlipidemia, unspecified: Secondary | ICD-10-CM | POA: Diagnosis not present

## 2018-04-18 DIAGNOSIS — F339 Major depressive disorder, recurrent, unspecified: Secondary | ICD-10-CM | POA: Diagnosis not present

## 2018-04-18 DIAGNOSIS — R627 Adult failure to thrive: Secondary | ICD-10-CM | POA: Diagnosis not present

## 2018-04-18 DIAGNOSIS — G4733 Obstructive sleep apnea (adult) (pediatric): Secondary | ICD-10-CM | POA: Diagnosis not present

## 2018-04-25 DIAGNOSIS — K589 Irritable bowel syndrome without diarrhea: Secondary | ICD-10-CM | POA: Diagnosis not present

## 2018-04-25 DIAGNOSIS — G4733 Obstructive sleep apnea (adult) (pediatric): Secondary | ICD-10-CM | POA: Diagnosis not present

## 2018-04-25 DIAGNOSIS — M1990 Unspecified osteoarthritis, unspecified site: Secondary | ICD-10-CM | POA: Diagnosis not present

## 2018-04-25 DIAGNOSIS — M8888 Osteitis deformans of other bones: Secondary | ICD-10-CM | POA: Diagnosis not present

## 2018-04-25 DIAGNOSIS — E119 Type 2 diabetes mellitus without complications: Secondary | ICD-10-CM | POA: Diagnosis not present

## 2018-04-25 DIAGNOSIS — E785 Hyperlipidemia, unspecified: Secondary | ICD-10-CM | POA: Diagnosis not present

## 2018-04-25 DIAGNOSIS — F339 Major depressive disorder, recurrent, unspecified: Secondary | ICD-10-CM | POA: Diagnosis not present

## 2018-04-25 DIAGNOSIS — R627 Adult failure to thrive: Secondary | ICD-10-CM | POA: Diagnosis not present

## 2018-04-25 DIAGNOSIS — G7111 Myotonic muscular dystrophy: Secondary | ICD-10-CM | POA: Diagnosis not present

## 2018-05-13 ENCOUNTER — Ambulatory Visit: Payer: Medicare HMO | Admitting: Psychiatry

## 2018-05-22 ENCOUNTER — Encounter: Payer: Self-pay | Admitting: Psychiatry

## 2018-05-22 ENCOUNTER — Other Ambulatory Visit: Payer: Self-pay

## 2018-05-22 ENCOUNTER — Ambulatory Visit (INDEPENDENT_AMBULATORY_CARE_PROVIDER_SITE_OTHER): Payer: Medicare HMO | Admitting: Psychiatry

## 2018-05-22 VITALS — BP 112/79 | HR 73 | Temp 97.3°F

## 2018-05-22 DIAGNOSIS — F4310 Post-traumatic stress disorder, unspecified: Secondary | ICD-10-CM

## 2018-05-22 DIAGNOSIS — F341 Dysthymic disorder: Secondary | ICD-10-CM

## 2018-05-22 MED ORDER — BUPROPION HCL ER (SR) 150 MG PO TB12: 150.0000 mg | ORAL_TABLET | Freq: Every morning | ORAL | 1 refills | Status: DC

## 2018-05-22 MED ORDER — ZOLPIDEM TARTRATE 5 MG PO TABS: 5.0000 mg | ORAL_TABLET | Freq: Every evening | ORAL | 1 refills | Status: DC | PRN

## 2018-05-22 MED ORDER — QUETIAPINE FUMARATE 100 MG PO TABS: 100.0000 mg | ORAL_TABLET | Freq: Every day | ORAL | 1 refills | Status: DC

## 2018-05-22 MED ORDER — SERTRALINE HCL 100 MG PO TABS: 100.0000 mg | ORAL_TABLET | Freq: Every day | ORAL | 1 refills | Status: DC

## 2018-05-22 NOTE — Progress Notes (Signed)
Follow-up for this 67 year old woman with multiple medical problems and chronic depression.  She has been sick with pneumonia and more than one hospitalization recently.  She looks very run down.  Looks like she has lost weight.  Less mobile and strong than she was before.  Affect blunted.  Thoughts slowed but still lucid.  No suicidal thoughts no hallucinations no psychotic symptoms.  Patient is still able to articulate her symptoms well seems to be getting along well with her family and getting good support.  Casually dressed.  Decreased psychomotor activity.  Thoughts lucid.  No suicidal thoughts.  Good judgment and insight.  Continue current medicines as prescribed including antidepressants and medicines to help with sleep.  Review side effects.  Follow-up 6 months.

## 2018-06-16 DIAGNOSIS — E119 Type 2 diabetes mellitus without complications: Secondary | ICD-10-CM | POA: Diagnosis not present

## 2018-06-16 DIAGNOSIS — E782 Mixed hyperlipidemia: Secondary | ICD-10-CM | POA: Diagnosis not present

## 2018-06-23 DIAGNOSIS — E782 Mixed hyperlipidemia: Secondary | ICD-10-CM | POA: Diagnosis not present

## 2018-06-23 DIAGNOSIS — E119 Type 2 diabetes mellitus without complications: Secondary | ICD-10-CM | POA: Diagnosis not present

## 2018-06-23 DIAGNOSIS — G4733 Obstructive sleep apnea (adult) (pediatric): Secondary | ICD-10-CM | POA: Diagnosis not present

## 2018-10-21 DIAGNOSIS — E782 Mixed hyperlipidemia: Secondary | ICD-10-CM | POA: Diagnosis not present

## 2018-10-21 DIAGNOSIS — E119 Type 2 diabetes mellitus without complications: Secondary | ICD-10-CM | POA: Diagnosis not present

## 2018-10-28 ENCOUNTER — Other Ambulatory Visit: Payer: Self-pay | Admitting: Psychiatry

## 2018-10-28 ENCOUNTER — Telehealth: Payer: Self-pay

## 2018-10-28 ENCOUNTER — Other Ambulatory Visit: Payer: Self-pay | Admitting: Family Medicine

## 2018-10-28 DIAGNOSIS — Z0000 Encounter for general adult medical examination without abnormal findings: Secondary | ICD-10-CM | POA: Diagnosis not present

## 2018-10-28 DIAGNOSIS — E1169 Type 2 diabetes mellitus with other specified complication: Secondary | ICD-10-CM | POA: Diagnosis not present

## 2018-10-28 DIAGNOSIS — Z1231 Encounter for screening mammogram for malignant neoplasm of breast: Secondary | ICD-10-CM

## 2018-10-28 DIAGNOSIS — E785 Hyperlipidemia, unspecified: Secondary | ICD-10-CM | POA: Diagnosis not present

## 2018-10-28 DIAGNOSIS — G119 Hereditary ataxia, unspecified: Secondary | ICD-10-CM | POA: Diagnosis not present

## 2018-10-28 DIAGNOSIS — Z23 Encounter for immunization: Secondary | ICD-10-CM | POA: Diagnosis not present

## 2018-10-28 DIAGNOSIS — F3340 Major depressive disorder, recurrent, in remission, unspecified: Secondary | ICD-10-CM | POA: Diagnosis not present

## 2018-10-28 DIAGNOSIS — E782 Mixed hyperlipidemia: Secondary | ICD-10-CM | POA: Diagnosis not present

## 2018-10-28 MED ORDER — SERTRALINE HCL 100 MG PO TABS: 100.0000 mg | ORAL_TABLET | Freq: Every day | ORAL | 1 refills | Status: DC

## 2018-10-28 MED ORDER — QUETIAPINE FUMARATE 100 MG PO TABS: 100.0000 mg | ORAL_TABLET | Freq: Every day | ORAL | 1 refills | Status: DC

## 2018-10-28 MED ORDER — BUPROPION HCL ER (SR) 150 MG PO TB12: 150.0000 mg | ORAL_TABLET | Freq: Every morning | ORAL | 1 refills | Status: DC

## 2018-10-28 MED ORDER — ZOLPIDEM TARTRATE 5 MG PO TABS: 5.0000 mg | ORAL_TABLET | Freq: Every evening | ORAL | 1 refills | Status: DC | PRN

## 2018-10-28 NOTE — Telephone Encounter (Signed)
left message with Rebekah Warner (front desk) that she needs refills on her medications. wellbutrin, seroguel, zoloft, ambien.

## 2018-11-03 DIAGNOSIS — F329 Major depressive disorder, single episode, unspecified: Secondary | ICD-10-CM | POA: Diagnosis not present

## 2018-11-03 DIAGNOSIS — G4733 Obstructive sleep apnea (adult) (pediatric): Secondary | ICD-10-CM | POA: Diagnosis not present

## 2018-11-03 DIAGNOSIS — G7111 Myotonic muscular dystrophy: Secondary | ICD-10-CM | POA: Diagnosis not present

## 2018-11-10 DIAGNOSIS — H524 Presbyopia: Secondary | ICD-10-CM | POA: Diagnosis not present

## 2018-11-10 DIAGNOSIS — Z0100 Encounter for examination of eyes and vision without abnormal findings: Secondary | ICD-10-CM | POA: Diagnosis not present

## 2018-11-11 ENCOUNTER — Ambulatory Visit
Admission: RE | Admit: 2018-11-11 | Discharge: 2018-11-11 | Disposition: A | Discharge status: Home / Self Care | Payer: Medicare HMO | Source: Ambulatory Visit | Attending: Family Medicine | Admitting: Family Medicine

## 2018-11-11 DIAGNOSIS — Z1231 Encounter for screening mammogram for malignant neoplasm of breast: Secondary | ICD-10-CM | POA: Diagnosis not present

## 2018-11-20 ENCOUNTER — Ambulatory Visit: Payer: Medicare HMO | Admitting: Psychiatry

## 2018-11-20 ENCOUNTER — Other Ambulatory Visit: Payer: Self-pay

## 2018-11-20 ENCOUNTER — Encounter: Payer: Self-pay | Admitting: Psychiatry

## 2018-11-20 VITALS — BP 116/76 | HR 55 | Temp 97.9°F

## 2018-11-20 DIAGNOSIS — F4310 Post-traumatic stress disorder, unspecified: Secondary | ICD-10-CM

## 2018-11-20 DIAGNOSIS — F332 Major depressive disorder, recurrent severe without psychotic features: Secondary | ICD-10-CM

## 2018-11-20 DIAGNOSIS — F341 Dysthymic disorder: Secondary | ICD-10-CM | POA: Diagnosis not present

## 2018-11-20 MED ORDER — BUPROPION HCL ER (SR) 150 MG PO TB12: 150.0000 mg | ORAL_TABLET | Freq: Every morning | ORAL | 1 refills | Status: DC

## 2018-11-20 MED ORDER — QUETIAPINE FUMARATE 100 MG PO TABS: 100.0000 mg | ORAL_TABLET | Freq: Every day | ORAL | 1 refills | Status: DC

## 2018-11-20 MED ORDER — SERTRALINE HCL 100 MG PO TABS: 100.0000 mg | ORAL_TABLET | Freq: Every day | ORAL | 1 refills | Status: DC

## 2018-11-20 MED ORDER — ZOLPIDEM TARTRATE ER 12.5 MG PO TBCR: 12.5000 mg | EXTENDED_RELEASE_TABLET | Freq: Every evening | ORAL | 1 refills | Status: DC | PRN

## 2018-11-20 NOTE — Progress Notes (Signed)
Follow-up patient with chronic anxiety and depression.  6 months ago her daughter died.  Patient obviously still grieving and extraordinarily sad.  Not depressed or suicidal however.  Does not express hopelessness.  No psychotic symptoms.  Her main complaint she wants [place] to address is her difficulty staying asleep all night long.  She falls asleep with some effort but then wakes up early in the morning for hours at a time.  Otherwise tolerating medicine no suicidal thinking no evidence of psychotic symptoms.  Supportive counseling.  Expressed sympathy.  Refill medications with change to Ambien CR 12 point 5 at night to help her stay asleep longer at night.  Follow-up 6 months

## 2019-01-02 ENCOUNTER — Telehealth: Payer: Self-pay

## 2019-01-02 ENCOUNTER — Telehealth: Payer: Self-pay | Admitting: Cardiovascular Disease

## 2019-01-02 NOTE — Telephone Encounter (Signed)
Patient scheduled for telephone evisit on 4/7 with Dr Rockey Situ

## 2019-01-02 NOTE — Telephone Encounter (Signed)
Virtual Visit Pre-Appointment Phone Call  Steps For Call:  1. Confirm consent - "In the setting of the current Covid19 crisis, you are scheduled for a (phone or video) visit with your provider on (date) at (time).  Just as we do with many in-office visits, in order for you to participate in this visit, we must obtain consent.  If you'd like, I can send this to your mychart (if signed up) or email for you to review.  Otherwise, I can obtain your verbal consent now.  All virtual visits are billed to your insurance company just like a normal visit would be.  By agreeing to a virtual visit, we'd like you to understand that the technology does not allow for your provider to perform an examination, and thus may limit your provider's ability to fully assess your condition.  Finally, though the technology is pretty good, we cannot assure that it will always work on either your or our end, and in the setting of a video visit, we may have to convert it to a phone-only visit.  In either situation, we cannot ensure that we have a secure connection.  Are you willing to proceed?"  2. Give patient instructions for WebEx download to smartphone as below if video visit  3. Advise patient to be prepared with any vital sign or heart rhythm information, their current medicines, and a piece of paper and pen handy for any instructions they may receive the day of their visit  4. Inform patient they will receive a phone call 15 minutes prior to their appointment time (may be from unknown caller ID) so they should be prepared to answer  5. Confirm that appointment type is correct in Epic appointment notes (video vs telephone)    TELEPHONE CALL NOTE  Rebekah Warner has been deemed a candidate for a follow-up tele-health visit to limit community exposure during the Covid-19 pandemic. I spoke with the patient via phone to ensure availability of phone/video source, confirm preferred email & phone number, and discuss  instructions and expectations.  I reminded Rebekah Warner to be prepared with any vital sign and/or heart rhythm information that could potentially be obtained via home monitoring, at the time of her visit. I reminded Rebekah Warner to expect a phone call at the time of her visit if her visit.  Did the patient verbally acknowledge consent to treatment?  Yes   Ace Gins 01/02/2019 3:45 PM   DOWNLOADING THE [place], go to [employer] and type in WebEx in the search bar. [place] [employer], the blue/green circle. The app is free but as with any other app downloads, their phone may require them to verify saved payment information or Apple password. The patient does NOT have to create an account.  - If Android, ask patient to go to [employer] and type in WebEx in the search bar. [place] [employer], the blue/green circle. The app is free but as with any other app downloads, their phone may require them to verify saved payment information or Android password. The patient does NOT have to create an account.   CONSENT FOR TELE-HEALTH VISIT - PLEASE REVIEW  I hereby voluntarily request, consent and authorize [facility] and its employed or contracted physicians, physician assistants, nurse practitioners or other licensed health care professionals (the Practitioner), to provide me with telemedicine health care services (the Services") as deemed necessary by the treating Practitioner.  I acknowledge and consent to receive the Services by the Practitioner via telemedicine. I understand that the telemedicine visit will involve communicating with the Practitioner through live audiovisual communication technology and the disclosure of certain medical information by electronic transmission. I acknowledge that I have been given the opportunity to request an in-person assessment or other available alternative prior to the telemedicine visit and am  voluntarily participating in the telemedicine visit.  I understand that I have the right to withhold or withdraw my consent to the use of telemedicine in the course of my care at any time, without affecting my right to future care or treatment, and that the Practitioner or I may terminate the telemedicine visit at any time. I understand that I have the right to inspect all information obtained and/or recorded in the course of the telemedicine visit and may receive copies of available information for a reasonable fee.  I understand that some of the potential risks of receiving the Services via telemedicine include:   Delay or interruption in medical evaluation due to technological equipment failure or disruption;  Information transmitted may not be sufficient (e.g. poor resolution of images) to allow for appropriate medical decision making by the Practitioner; and/or   In rare instances, security protocols could fail, causing a breach of personal health information.  Furthermore, I acknowledge that it is my responsibility to provide information about my medical history, conditions and care that is complete and accurate to the best of my ability. I acknowledge that Practitioner's advice, recommendations, and/or decision may be based on factors not within their control, such as incomplete or inaccurate data provided by me or distortions of diagnostic images or specimens that may result from electronic transmissions. I understand that the practice of medicine is not an exact science and that Practitioner makes no warranties or guarantees regarding treatment outcomes. I acknowledge that I will receive a copy of this consent concurrently upon execution via email to the email address I last provided but may also request a printed copy by calling the office of [facility].    I understand that my insurance will be billed for this visit.   I have read or had this consent read to me.  I understand the  contents of this consent, which adequately explains the benefits and risks of the Services being provided via telemedicine.   I have been provided ample opportunity to ask questions regarding this consent and the Services and have had my questions answered to my satisfaction.  I give my informed consent for the services to be provided through the use of telemedicine in my medical care  By participating in this telemedicine visit I agree to the above.

## 2019-01-02 NOTE — Telephone Encounter (Signed)
LMOV for patient to call back. Patient needs to be converted to EVISIT

## 2019-01-12 NOTE — Progress Notes (Unsigned)
Telephone Visit     Evaluation Performed:  Follow-up visit  This visit type was conducted due to national recommendations for restrictions regarding the COVID-19 Pandemic (e.g. social distancing).  This format is felt to be most appropriate for this patient at this time.  All issues noted in this document were discussed and addressed.  No physical exam was performed (except for noted visual exam findings with Telehealth visits).  See MyChart message from today for the patient's consent to telehealth for [facility].  Date:  01/12/2019   ID:  Rebekah Warner, DOB July 09, 1951, MRN [id number]  Patient Location:  [address] [place] [address]   Provider location:   [facility], [place] office  PCP:  Dion Body, MD  Cardiologist:  Patsy Baltimore  Chief Complaint:      History of Present Illness:    Rebekah Warner is a 68 y.o. female who presents via audio/video conferencing for a telehealth visit today.   The patient does not symptoms concerning for COVID-19 infection (fever, chills, cough, or new SHORTNESS OF BREATH).   Patient has a past medical history of myotonic muscular dystrophy type 1 symptoms initially presenting in 1998,  shortness of breath, bradycardia.  Prior history of Paget's disease with complications and prolonged hospital course.  Hyperlipidemia,  CVA x2, first after surgery and one after a fall while in rehab Her son died unexpectedly in sleep in 06-22-2013 She had cognitive impairment after stroke, but has made a good recovery chronic balance issues  OSA, on CPAP Chronic weakness in her legs, arms , falls on a daily basis She presents for routine follow-up of her shortness of breath symptoms and falls  In follow-up today she presents with her daughters who live with her They reports she has had Multiple falls Recent episode 1 month ago when she Got out of bed to go to bedside commode in the middle of the night  Fell in the  bedroom  Found at 5:30 Am by daughter,  on the ground couple hours Cut the left wrist Left  Family got her up, daughter with muscular dystrophy Must have hit her head as she had bruising on the right side of her head, soreness, unable to wear her CPAP without discomfort Biggest complaint is developing some scabs on her head  Long discussion concerning events of that night, Family reports that she fell, patient even told the daughters that she had fallen when they were picking her up  Did not have her night light on Having frequent falls, 3 falls yesterday Legs are stronger in the morning, weaker in the afternoon  Taking aleve for headaches since the fall 1 month ago     Prior CV studies:   The following studies were reviewed today:    Past Medical History:  Diagnosis Date  . Depression   . Diabetes mellitus without complication ([place])   . Dyslipidemia   . First degree AV block   . History of CVA (cerebrovascular accident)   . IBS (irritable bowel syndrome)   . [name], recurrent episode, in full remission ([place])   . [name], recurrent episode, mild ([place])   . Myotonic dystrophy, type 1 ([place])   . OSA (obstructive sleep apnea)   . Personal history of Paget's disease of bone   . Posttraumatic stress disorder   . [name] ([place])   . Sinus bradycardia   . Stroke [facility])  Past Surgical History:  Procedure Laterality Date  . BRAIN SURGERY    . brain surgery on Paget's    . CATARACT EXTRACTION    . COLONOSCOPY    . OOPHORECTOMY    . RETINAL DETACHMENT SURGERY    . TOTAL ABDOMINAL HYSTERECTOMY       No outpatient medications have been marked as taking for the 01/13/19 encounter (Appointment) with Minna Merritts, MD.     Allergies:   Other; Actos  [pioglitazone]; and Metformin   Social History   Tobacco Use  . Smoking status: Never Smoker  . Smokeless tobacco: Never Used  Substance Use  Topics  . Alcohol use: Never    Frequency: Never  . Drug use: Never     Current Outpatient Medications on File Prior to Visit  Medication Sig Dispense Refill  . albuterol (PROAIR HFA) 108 (90 BASE) MCG/ACT inhaler Inhale into the lungs.    Marland Kitchen buPROPion (WELLBUTRIN SR) 150 MG 12 hr tablet Take 1 tablet (150 mg total) by mouth every morning. 90 tablet 1  . econazole nitrate 1 % cream Apply topically daily. Apply to affected area in groin 30 g 1  . glimepiride (AMARYL) 1 MG tablet Take 1 tablet by mouth daily.    . megestrol (MEGACE ES) 625 MG/5ML suspension Take 5 mLs (625 mg total) by mouth daily. 75 mL 0  . metoCLOPramide (REGLAN) 10 MG tablet Take 1 tablet (10 mg total) by mouth every 6 (six) hours as needed. 30 tablet 0  . naproxen sodium (ALEVE) 220 MG tablet Take 220 mg by mouth daily as needed.    . nitrofurantoin, macrocrystal-monohydrate, (MACROBID) 100 MG capsule Take 100 mg by mouth 2 (two) times daily.  0  . NON FORMULARY CPAP    . QUEtiapine (SEROQUEL) 100 MG tablet Take 1 tablet (100 mg total) by mouth at bedtime. 90 tablet 1  . Red Yeast Rice Extract (RED YEAST RICE PO) Take by mouth daily.    . sertraline (ZOLOFT) 100 MG tablet Take 1 tablet (100 mg total) by mouth daily. 90 tablet 1  . traMADol (ULTRAM) 50 MG tablet Take 1 tablet by mouth every 4 (four) hours as needed.    . zolpidem (AMBIEN CR) 12.5 MG CR tablet Take 1 tablet (12.5 mg total) by mouth at bedtime as needed for sleep. 90 tablet 1   No current facility-administered medications on file prior to visit.      Family Hx: The patient's She was adopted. Family history is unknown by patient.  ROS:   Please see the history of present illness.    ROS    Labs/Other Tests and Data Reviewed:    Recent Labs: 03/15/2018: ALT 46; BUN 19; Creatinine, Ser 0.66; Hemoglobin 15.6; Platelets 379; Potassium 3.8; Sodium 135   Recent Lipid Panel Lab Results  Component Value Date/Time   CHOL 107 10/29/2011 07:58 AM   TRIG  206 (H) 10/29/2011 07:58 AM   HDL 20 (L) 10/29/2011 07:58 AM   LDLCALC 46 10/29/2011 07:58 AM    Wt Readings from Last 3 Encounters:  03/15/18 160 lb (72.6 kg)  03/09/18 175 lb 0.7 oz (79.4 kg)  01/08/18 165 lb (74.8 kg)     Exam:    Vital Signs: Vital signs as detailed above in HPI  Well nourished, well developed female in no acute distress. Constitutional:  oriented to person, place, and time. No distress.  Head: Normocephalic and atraumatic.  Eyes:  no discharge. No scleral icterus.  Neck: Normal range of motion. Neck supple.  Pulmonary/Chest: No audible wheezing, no distress, appears comfortable Musculoskeletal: Normal range of motion.  no  tenderness or deformity.  Neurological:   Coordination normal. Full exam not performed Skin:  No rash Psychiatric:  normal mood and affect. behavior is normal. Thought content normal.    ASSESSMENT & PLAN:    Cerebrovascular accident, old - Plan: EKG 12-Lead  recovered well from her stroke,  weakness secondary to muscular dystrophy More frequent falls  Frequent falls Recent fall in the middle of the night likely responsible for injury 1 month ago going to the bathroom Uses a bedside commode, has hand railings everywhere to help support her Despite this still having frequent falls and injury Detailed discussion with patient and 2 daughters, they do not think she had syncope only a fall They have not seen her of any other syncope or near syncope symptoms Discussed syncope workup that could be done if she has any symptoms concerning for syncope  Controlled type 2 diabetes mellitus without complication, without long-term current use of insulin ([place]) - Plan: EKG 12-Lead Managed by primary care  Esophageal dysphagia Previously seen by GI Was less of an issue on today's visit  Bradycardia Heart rate in the 70s, no further workup needed  Shortness of breath Not active, high fall risk, only walks short distances with hand  railing and assistance, unable to walk with a walker unless she has significant assistance  COVID-19 Education: The signs and symptoms of COVID-19 were discussed with the patient and how to seek care for testing (follow up with PCP or arrange E-visit).  The importance of social distancing was discussed today.  Patient Risk:   After full review of this patients clinical status, I feel that they are at least moderate risk at this time.  Time:   Today, I have spent 25 minutes with the patient with telehealth technology discussing .     Medication Adjustments/Labs and Tests Ordered: Current medicines are reviewed at length with the patient today.  Concerns regarding medicines are outlined above.   Tests Ordered: No tests ordered   Medication Changes: No changes made   Disposition: Follow-up in 6 months   Signed, Ida Rogue, MD  01/12/2019 6:40 PM    [facility] Office [address], [place], [place] [address]

## 2019-01-13 ENCOUNTER — Telehealth: Payer: Self-pay

## 2019-01-13 ENCOUNTER — Telehealth: Payer: Medicare HMO | Admitting: Cardiovascular Disease

## 2019-01-13 ENCOUNTER — Other Ambulatory Visit: Payer: Self-pay

## 2019-01-13 NOTE — Telephone Encounter (Signed)
LMOV for patient to call back.  Calling to input vitals for Dr. Rockey Situ

## 2019-06-11 ENCOUNTER — Ambulatory Visit (INDEPENDENT_AMBULATORY_CARE_PROVIDER_SITE_OTHER): Payer: Medicare HMO | Admitting: Psychiatry

## 2019-06-11 ENCOUNTER — Other Ambulatory Visit: Payer: Self-pay

## 2019-06-11 DIAGNOSIS — F4310 Post-traumatic stress disorder, unspecified: Secondary | ICD-10-CM | POA: Diagnosis not present

## 2019-06-11 DIAGNOSIS — F341 Dysthymic disorder: Secondary | ICD-10-CM | POA: Diagnosis not present

## 2019-06-11 MED ORDER — QUETIAPINE FUMARATE 100 MG PO TABS: 100.0000 mg | ORAL_TABLET | Freq: Every day | ORAL | 1 refills | Status: DC

## 2019-06-11 MED ORDER — SERTRALINE HCL 100 MG PO TABS: 100.0000 mg | ORAL_TABLET | Freq: Every day | ORAL | 1 refills | Status: DC

## 2019-06-11 MED ORDER — ZOLPIDEM TARTRATE ER 12.5 MG PO TBCR: 12.5000 mg | EXTENDED_RELEASE_TABLET | Freq: Every evening | ORAL | 1 refills | Status: DC | PRN

## 2019-06-11 MED ORDER — BUPROPION HCL ER (SR) 150 MG PO TB12: 150.0000 mg | ORAL_TABLET | Freq: Every morning | ORAL | 1 refills | Status: DC

## 2019-06-11 NOTE — Progress Notes (Signed)
Follow-up for this patient with chronic depression and anxiety.  Reach the patient by telephone for scheduled appointment.  Patient was on time and appropriate.  Difficult to understand at times as her speech seems to be more and more affected by her illness.  Patient tells me that her daughter died this past 01-08-2023.  Husband in the background confirms this.  Patient is obviously distraught.  This was her last daughter to pass away.  Mood is bad.  Sleep is bad.  Eating adequately.  Denies any suicidal ideation.  Affect from her voice sounds dysphoric but not sobbing.  Thoughts appear to be lucid not out of control.  Denies any suicidal thoughts.  Does not appear to be psychotic.  I reviewed with her my concern that I was going to discuss anyway which is whether her psychiatric medicines are increasing her risk of falls.  Patient tells me at this point she is no longer able to ambulate and is not having falls at night as a result because she is not getting up.  Supportive counseling.  Offered empathy and support.  Reminded patient to keep the support of her family and anything else that brings her comfort.  She had run out of her antidepressant and this is obviously not a good time for it.  Refilled everything for 90-day prescriptions and sent him to [facility] at her request rather than [facility] mail in.  Follow-up in 6 months or earlier if needed.

## 2019-06-16 ENCOUNTER — Other Ambulatory Visit: Payer: Self-pay | Admitting: Psychiatry

## 2019-06-16 MED ORDER — ZOLPIDEM TARTRATE 10 MG PO TABS: 10.0000 mg | ORAL_TABLET | Freq: Every evening | ORAL | 1 refills | Status: DC | PRN

## 2020-02-15 ENCOUNTER — Telehealth: Payer: Self-pay

## 2020-02-15 DIAGNOSIS — F4310 Post-traumatic stress disorder, unspecified: Secondary | ICD-10-CM

## 2020-02-15 DIAGNOSIS — F341 Dysthymic disorder: Secondary | ICD-10-CM

## 2020-02-15 MED ORDER — ZOLPIDEM TARTRATE 10 MG PO TABS: 10.0000 mg | ORAL_TABLET | Freq: Every evening | ORAL | 0 refills | Status: DC | PRN

## 2020-02-15 MED ORDER — BUPROPION HCL ER (SR) 150 MG PO TB12: 150.0000 mg | ORAL_TABLET | Freq: Every morning | ORAL | 0 refills | Status: DC

## 2020-02-15 MED ORDER — QUETIAPINE FUMARATE 100 MG PO TABS: 100.0000 mg | ORAL_TABLET | Freq: Every day | ORAL | 0 refills | Status: DC

## 2020-02-15 NOTE — Telephone Encounter (Signed)
I have sent her Wellbutrin, Seroquel and Ambien 10 mg-8-day supply until her appointment on 02/23/2020.

## 2020-02-15 NOTE — Telephone Encounter (Signed)
This is a Dr. Weber Cooks patient, husband made appt for 02-23-20 can you send in just a weeks worth of medication to do until they can see dr. Weber Cooks.

## 2020-02-23 ENCOUNTER — Other Ambulatory Visit: Payer: Self-pay | Admitting: Psychiatry

## 2020-02-23 ENCOUNTER — Other Ambulatory Visit: Payer: Self-pay

## 2020-02-23 ENCOUNTER — Encounter: Payer: Self-pay | Admitting: Psychiatry

## 2020-02-23 ENCOUNTER — Telehealth (INDEPENDENT_AMBULATORY_CARE_PROVIDER_SITE_OTHER): Payer: Medicare HMO | Admitting: Psychiatry

## 2020-02-23 DIAGNOSIS — F332 Major depressive disorder, recurrent severe without psychotic features: Secondary | ICD-10-CM | POA: Diagnosis not present

## 2020-02-23 DIAGNOSIS — F4310 Post-traumatic stress disorder, unspecified: Secondary | ICD-10-CM

## 2020-02-23 MED ORDER — ZOLPIDEM TARTRATE 10 MG PO TABS: 10.0000 mg | ORAL_TABLET | Freq: Every evening | ORAL | 1 refills | Status: DC | PRN

## 2020-02-23 MED ORDER — SERTRALINE HCL 100 MG PO TABS: 100.0000 mg | ORAL_TABLET | Freq: Every day | ORAL | 1 refills | Status: DC

## 2020-02-23 NOTE — Progress Notes (Signed)
Follow-up for this patient with a history of chronic anxiety and depression.  Reach patient by telephone.  Her husband assisted in the background as well.  Patient was clearly very distraught.  Worse than the last time we spoke.  Sections of her conversation were very difficult to understand because she was sobbing or crying loudly.  Patient reports that she feels sad constantly.  Discusses of course that her children have all died.  Also her illness progresses.  Continues to have trouble speaking and apparently has lost all mobility.  Patient is focused on her sleeping medicine saying that is the only thing that helps her.  She says that she takes the Zoloft only "when I am crying" but that that is not every day.  She is not taking the Seroquel or the Wellbutrin and in fact denies that she is taking any other medicines whatsoever.  As the conversation progressed it was easier to understand her.  Indicates that she is not overwrought all the time but it sounds pretty frequent.  Has trouble sleeping without medication.  Denies however any suicidal thoughts whatsoever.  Does not appear to be having psychotic symptoms.  Not able to test memory or cognitive functioning but sounds somewhat impaired which has been the case for a while.  Psychoeducation about use of medicine.  Advised her to please take her Zoloft every day or it would not be of any help for depression.  Patient is not willing to consider taking other medicines saying they all make her sick to her stomach.  I have refilled her Ambien and her Zoloft.  Patient is aware that she can call sooner or come to the hospital if needed.  Attempted to give supportive counseling.  Follow-up in another 6 weeks.  Total time spent with patient 16 minutes.

## 2020-08-23 ENCOUNTER — Other Ambulatory Visit: Payer: Self-pay | Admitting: Psychiatry

## 2020-08-23 DIAGNOSIS — F4310 Post-traumatic stress disorder, unspecified: Secondary | ICD-10-CM

## 2020-08-23 MED ORDER — ZOLPIDEM TARTRATE 10 MG PO TABS: 10.0000 mg | ORAL_TABLET | Freq: Every evening | ORAL | 1 refills | Status: DC | PRN

## 2020-08-23 MED ORDER — SERTRALINE HCL 100 MG PO TABS: 100.0000 mg | ORAL_TABLET | Freq: Every day | ORAL | 1 refills | Status: DC

## 2020-08-25 ENCOUNTER — Encounter: Payer: Self-pay | Admitting: Psychiatry

## 2020-08-25 ENCOUNTER — Other Ambulatory Visit: Payer: Self-pay

## 2020-08-25 ENCOUNTER — Telehealth (INDEPENDENT_AMBULATORY_CARE_PROVIDER_SITE_OTHER): Payer: Medicare HMO | Admitting: Psychiatry

## 2020-08-25 DIAGNOSIS — F4310 Post-traumatic stress disorder, unspecified: Secondary | ICD-10-CM | POA: Diagnosis not present

## 2020-08-25 DIAGNOSIS — F332 Major depressive disorder, recurrent severe without psychotic features: Secondary | ICD-10-CM

## 2020-08-25 NOTE — Progress Notes (Signed)
Virtual Visit via Telephone Note  I connected with Rebekah Warner on 08/25/20 at  1:40 PM EST by telephone and verified that I am speaking with the correct person using two identifiers.  Location: Patient: Home Provider: Hospital   I discussed the limitations, risks, security and privacy concerns of performing an evaluation and management service by telephone and the availability of in person appointments. I also discussed with the patient that there may be a patient responsible charge related to this service. The patient expressed understanding and agreed to proceed.   History of Present Illness: Patient reached by telephone.  69 year old woman with multiple severe medical problems as well as chronic depression and anxiety.  Spoke with the patient and her husband briefly.  She did start taking the Zoloft again after our last conversation and she and the husband thinks it makes some difference.  Mood not quite as down tearful and overwhelmed.  Still of course and chronic grieving for her children.  Sleep is adequate with the Ambien.   Observations/Objective: Patient is very limited in her conversation now.  Affect flat.  Reports that she has lost the power to stand at this point.  She does say she is cooperative with medicine and values the love of her husband.  Does not report any suicidal thoughts   Assessment and Plan: Continue the Ambien and the Zoloft.  Follow-up 6 months   Follow Up Instructions: Call for follow-up    I discussed the assessment and treatment plan with the patient. The patient was provided an opportunity to ask questions and all were answered. The patient agreed with the plan and demonstrated an understanding of the instructions.   The patient was advised to call back or seek an in-person evaluation if the symptoms worsen or if the condition fails to improve as anticipated.  I provided 23minutes of non-face-to-face time during this encounter.   Alethia Berthold, MD

## 2021-02-23 ENCOUNTER — Other Ambulatory Visit: Payer: Self-pay

## 2021-02-23 ENCOUNTER — Telehealth (HOSPITAL_BASED_OUTPATIENT_CLINIC_OR_DEPARTMENT_OTHER): Payer: Medicare HMO | Admitting: Psychiatry

## 2021-02-23 DIAGNOSIS — F32A Depression, unspecified: Secondary | ICD-10-CM

## 2021-02-23 DIAGNOSIS — F4310 Post-traumatic stress disorder, unspecified: Secondary | ICD-10-CM

## 2021-02-23 MED ORDER — SERTRALINE HCL 100 MG PO TABS: 100.0000 mg | ORAL_TABLET | Freq: Every day | ORAL | 1 refills | Status: DC

## 2021-02-23 MED ORDER — MIRTAZAPINE 15 MG PO TBDP: 15.0000 mg | ORAL_TABLET | Freq: Every day | ORAL | 1 refills | Status: AC

## 2021-02-23 MED ORDER — ZOLPIDEM TARTRATE 10 MG PO TABS: 10.0000 mg | ORAL_TABLET | Freq: Every evening | ORAL | 1 refills | Status: DC | PRN

## 2021-02-23 NOTE — Progress Notes (Signed)
Virtual Visit via Telephone Note  I connected with Rebekah Warner on 02/23/21 at  2:00 PM EDT by telephone and verified that I am speaking with the correct person using two identifiers.  Location: Patient: Home Provider: Hospital   I discussed the limitations, risks, security and privacy concerns of performing an evaluation and management service by telephone and the availability of in person appointments. I also discussed with the patient that there may be a patient responsible charge related to this service. The patient expressed understanding and agreed to proceed.   History of Present Illness: Patient contacted by phone.  Spoke with her with some intermediary help from her husband because her voice is getting so quiet.  Patient continues to endorse depressed mood.  No suicidal intent.  Weakness progresses no improvement in her medical condition.  No reported psychosis.  Patient is having trouble complying with the Zoloft because husband says that the pill seems too big for her.   Observations/Objective: Voice very quiet.  Weakness progressing.  Affect sounds blunted and negative.  No active suicidal intent.  No evidence psychosis   Assessment and Plan: Reviewed medication.  Patient focused on her sleep.  Wants to make sure her Ambien is refilled.  We talked about some ways to meet some of her needs and I suggested adding mirtazapine 15 mg dissolving tablets at night because it can help with her mood and sleep and would be easy to swallow.  Patient and husband are agreeable.  Adding that to her current medicine and renewing the Zoloft with recommendation that they break the pills up smaller if needed.  Follow up in 3 months to see how this is working.  3 month Follow Up Instructions:    I discussed the assessment and treatment plan with the patient. The patient was provided an opportunity to ask questions and all were answered. The patient agreed with the plan and demonstrated an  understanding of the instructions.   The patient was advised to call back or seek an in-person evaluation if the symptoms worsen or if the condition fails to improve as anticipated.  I provided 20 minutes of non-face-to-face time during this encounter.   Alethia Berthold, MD

## 2021-05-03 ENCOUNTER — Other Ambulatory Visit: Payer: Self-pay

## 2021-05-03 ENCOUNTER — Observation Stay
Admission: EM | Admit: 2021-05-03 | Discharge: 2021-05-03 | Disposition: A | Discharge status: Home / Self Care | Payer: Medicare HMO | Attending: Emergency Medicine | Admitting: Emergency Medicine

## 2021-05-03 ENCOUNTER — Observation Stay: Payer: Medicare HMO

## 2021-05-03 ENCOUNTER — Emergency Department: Payer: Medicare HMO

## 2021-05-03 DIAGNOSIS — R410 Disorientation, unspecified: Secondary | ICD-10-CM | POA: Diagnosis not present

## 2021-05-03 DIAGNOSIS — I440 Atrioventricular block, first degree: Secondary | ICD-10-CM | POA: Diagnosis not present

## 2021-05-03 DIAGNOSIS — R404 Transient alteration of awareness: Secondary | ICD-10-CM | POA: Diagnosis not present

## 2021-05-03 DIAGNOSIS — R4182 Altered mental status, unspecified: Secondary | ICD-10-CM | POA: Diagnosis not present

## 2021-05-03 DIAGNOSIS — G119 Hereditary ataxia, unspecified: Secondary | ICD-10-CM | POA: Diagnosis not present

## 2021-05-03 DIAGNOSIS — E119 Type 2 diabetes mellitus without complications: Secondary | ICD-10-CM | POA: Diagnosis not present

## 2021-05-03 DIAGNOSIS — N838 Other noninflammatory disorders of ovary, fallopian tube and broad ligament: Secondary | ICD-10-CM

## 2021-05-03 DIAGNOSIS — R5383 Other fatigue: Secondary | ICD-10-CM | POA: Diagnosis not present

## 2021-05-03 DIAGNOSIS — R296 Repeated falls: Secondary | ICD-10-CM | POA: Diagnosis not present

## 2021-05-03 DIAGNOSIS — R001 Bradycardia, unspecified: Secondary | ICD-10-CM | POA: Insufficient documentation

## 2021-05-03 DIAGNOSIS — Z7984 Long term (current) use of oral hypoglycemic drugs: Secondary | ICD-10-CM | POA: Insufficient documentation

## 2021-05-03 DIAGNOSIS — F329 Major depressive disorder, single episode, unspecified: Secondary | ICD-10-CM | POA: Diagnosis not present

## 2021-05-03 DIAGNOSIS — G319 Degenerative disease of nervous system, unspecified: Secondary | ICD-10-CM | POA: Diagnosis not present

## 2021-05-03 DIAGNOSIS — R29818 Other symptoms and signs involving the nervous system: Secondary | ICD-10-CM | POA: Diagnosis not present

## 2021-05-03 DIAGNOSIS — N390 Urinary tract infection, site not specified: Secondary | ICD-10-CM | POA: Diagnosis not present

## 2021-05-03 DIAGNOSIS — F32A Depression, unspecified: Secondary | ICD-10-CM

## 2021-05-03 DIAGNOSIS — R7303 Prediabetes: Secondary | ICD-10-CM | POA: Diagnosis not present

## 2021-05-03 DIAGNOSIS — G7100 Muscular dystrophy, unspecified: Secondary | ICD-10-CM | POA: Diagnosis not present

## 2021-05-03 DIAGNOSIS — L309 Dermatitis, unspecified: Secondary | ICD-10-CM | POA: Diagnosis present

## 2021-05-03 LAB — URINALYSIS, COMPLETE (UACMP) WITH MICROSCOPIC
Bacteria, UA: NONE SEEN
Bilirubin Urine: NEGATIVE
Glucose, UA: NEGATIVE mg/dL
Ketones, ur: NEGATIVE mg/dL
Leukocytes,Ua: NEGATIVE
Nitrite: NEGATIVE
Protein, ur: NEGATIVE mg/dL
Specific Gravity, Urine: 1.014 (ref 1.005–1.030)
pH: 5 (ref 5.0–8.0)

## 2021-05-03 LAB — TSH: TSH: 1.155 u[IU]/mL (ref 0.350–4.500)

## 2021-05-03 LAB — CBC
HCT: 41.8 % (ref 36.0–46.0)
Hemoglobin: 14.4 g/dL (ref 12.0–15.0)
MCH: 30.6 pg (ref 26.0–34.0)
MCHC: 34.4 g/dL (ref 30.0–36.0)
MCV: 88.9 fL (ref 80.0–100.0)
Platelets: 277 10*3/uL (ref 150–400)
RBC: 4.7 MIL/uL (ref 3.87–5.11)
RDW: 13.1 % (ref 11.5–15.5)
WBC: 7.2 10*3/uL (ref 4.0–10.5)
nRBC: 0 % (ref 0.0–0.2)

## 2021-05-03 LAB — COMPREHENSIVE METABOLIC PANEL
ALT: 15 U/L (ref 0–44)
AST: 21 U/L (ref 15–41)
Albumin: 3.4 g/dL — ABNORMAL LOW (ref 3.5–5.0)
Alkaline Phosphatase: 86 U/L (ref 38–126)
Anion gap: 7 (ref 5–15)
BUN: 16 mg/dL (ref 8–23)
CO2: 31 mmol/L (ref 22–32)
Calcium: 10 mg/dL (ref 8.9–10.3)
Chloride: 102 mmol/L (ref 98–111)
Creatinine, Ser: 0.48 mg/dL (ref 0.44–1.00)
GFR, Estimated: >60 mL/min (ref >60)
Glucose, Bld: 102 mg/dL — ABNORMAL HIGH (ref 70–99)
Potassium: 4.1 mmol/L (ref 3.5–5.1)
Sodium: 140 mmol/L (ref 135–145)
Total Bilirubin: 0.6 mg/dL (ref 0.3–1.2)
Total Protein: 7.4 g/dL (ref 6.5–8.1)

## 2021-05-03 LAB — TROPONIN I (HIGH SENSITIVITY)
Troponin I (High Sensitivity): 13 ng/L (ref <18)
Troponin I (High Sensitivity): 12 ng/L (ref <18)

## 2021-05-03 LAB — AMMONIA: Ammonia: 16 umol/L (ref 9–35)

## 2021-05-03 LAB — HIV ANTIBODY (ROUTINE TESTING W REFLEX): HIV Screen 4th Generation wRfx: NONREACTIVE

## 2021-05-03 LAB — VITAMIN B12: Vitamin B-12: 322 pg/mL (ref 180–914)

## 2021-05-03 MED ORDER — INSULIN ASPART 100 UNIT/ML IJ SOLN: 0.0000 [IU] | Freq: Every day | INTRAMUSCULAR | Status: DC

## 2021-05-03 MED ORDER — ONDANSETRON HCL 4 MG/2ML IJ SOLN: 4.0000 mg | Freq: Four times a day (QID) | INTRAMUSCULAR | Status: DC | PRN

## 2021-05-03 MED ORDER — SERTRALINE HCL 50 MG PO TABS: 100.0000 mg | ORAL_TABLET | Freq: Every day | ORAL | Status: DC

## 2021-05-03 MED ORDER — ONDANSETRON HCL 4 MG PO TABS
4.0000 mg | ORAL_TABLET | Freq: Four times a day (QID) | ORAL | Status: DC | PRN
Start: 2021-05-03 — End: 2021-05-04

## 2021-05-03 MED ORDER — TRIAMCINOLONE ACETONIDE 0.5 % EX CREA
TOPICAL_CREAM | Freq: Two times a day (BID) | CUTANEOUS | Status: DC
  Filled 2021-05-03: qty 15

## 2021-05-03 MED ORDER — ACETAMINOPHEN 325 MG PO TABS: 650.0000 mg | ORAL_TABLET | Freq: Four times a day (QID) | ORAL | Status: DC | PRN

## 2021-05-03 MED ORDER — ALBUTEROL SULFATE (2.5 MG/3ML) 0.083% IN NEBU: 3.0000 mL | INHALATION_SOLUTION | Freq: Four times a day (QID) | RESPIRATORY_TRACT | Status: DC | PRN

## 2021-05-03 MED ORDER — MIRTAZAPINE 15 MG PO TBDP
15.0000 mg | ORAL_TABLET | Freq: Every day | ORAL | Status: DC
Start: 2021-05-03 — End: 2021-05-04
  Filled 2021-05-03: qty 1

## 2021-05-03 MED ORDER — INSULIN ASPART 100 UNIT/ML IJ SOLN: 0.0000 [IU] | Freq: Three times a day (TID) | INTRAMUSCULAR | Status: DC

## 2021-05-03 MED ORDER — ACETAMINOPHEN 650 MG RE SUPP: 650.0000 mg | Freq: Four times a day (QID) | RECTAL | Status: DC | PRN

## 2021-05-03 MED ORDER — METOCLOPRAMIDE HCL 10 MG PO TABS: 10.0000 mg | ORAL_TABLET | Freq: Four times a day (QID) | ORAL | Status: DC | PRN

## 2021-05-03 MED ORDER — ENOXAPARIN SODIUM 40 MG/0.4ML IJ SOSY: 40.0000 mg | PREFILLED_SYRINGE | Freq: Every day | INTRAMUSCULAR | Status: DC

## 2021-05-03 NOTE — H&P (Addendum)
History and Physical   Rebekah Warner [id number] DOB: [birth year] DOA: 05/03/2021  PCP: Dion Body, MD  Outpatient Specialists: Dr. Rockey Situ, cardiology Patient coming from: home   I have personally briefly reviewed patient's old medical records in [place].  Chief Concern: Altered mental status  HPI: Rebekah Warner is a 70 y.o. female with medical history significant for history of left occipital craniotomy with postsurgical encephalomalacia of the left cerebellum,Depression, history of IBS, history of first-degree AV block, history of CVA, PTSD, sinus bradycardia, presents to the emergency department from home for chief concerns of altered mentation.  At bedside, patient was able to mumble to me her name, states that she is 70 years old, was not able to tell me the current calendar year, states that she is in [place], and knows that her husband at bedside is Richard.  He reports that for the last 2 weeks patient has been altered.  She sits frequently in her electric recliner at home and for the past 2 weeks she has been pressing a button in order to get ice into her cup.  She normally does not walk due to profound weakness at baseline and her care is 100% provided for by her husband.  He denies that she has had any fever, vomiting.  He does endorse that patient is incontinent urinary and bowel at baseline.  He also further endorses that patient has bilateral sacral ulcers however on my physical examination with the nurse nursing staff at bedside there was no skin breakdown.  He then endorses that patient has protruding bone at the sacral and is concerned that she broke something because she fell 2 weeks ago.  He denies patient hitting her head.  Patient's husband states that patient has only been out of the house 2 times in the last 5 years.  She has not been to a doctor in 5 years aside from her hospitalization about 2 years ago for urinary tract infection.  He states that he  knows that the patient needs to go to the doctor however she refuses.  Social history: She lives at home with her husband who is the primary caregiver.  She does not use tobacco, drink alcohol or do recreational drugs.  She is currently disabled.  Vaccination history: Unknown  ROS: Unable to complete as patient is minimally verbal.  ED Course: Discussed with emergency medicine provider, patient requiring hospitalization for altered mental status.  Vitals in the emergency department was remarkable for temperature of 97.8, respiration rate of 16, heart rate 56 down to 40, blood pressure 105/68, SPO2 of 95% on room air.  Labs in the emergency department was remarkable for sodium 140, potassium 4.1, chloride 102, bicarb 31, BUN 16, serum creatinine of 0.48, nonfasting blood glucose 102, WBC 7.2, hemoglobin 14.4, platelets 277.  Initial troponin is 13 and down to 12.  CT of the head without contrast was read as negative for acute intracranial abnormality.  Assessment/Plan  Principal Problem:   Altered mental status Active Problems:   Bradycardia   Depressive disorder   Acute cerebellar ataxia (HCC)   Fatigue   1st degree AV block   Ovarian mass, right   Borderline diabetes mellitus   Cerebellar ataxia ([place])   # Altered mental status-query symptomatic bradycardia versus progression of encephalomalacia of the left cerebellum # Does not meet sepsis criteria with no fever, hypotension, no leukocytosis, and UA was negative for nitrates and leukocytes - Checking ammonia, B12, TSH - Ordered portable chest  x-ray ordered - MRI of the brain has been ordered - Complete echo ordered however as patient has not had any reported syncopal event and normal downtrending troponin, I doubt this is cardiac related at this time  # Profound weight loss-HIV ordered over the last 5 years - Recommend patient get regular follow-up with PCP and colonoscopy on discharge  # History of left occipital craniotomy  with postsurgical encephalomalacia of the left cerebellum-read as stable on CT  # Non-insulin-dependent diabetes mellitus - Holding home glimepiride 1 mg daily at this time - Insulin SSI with at bedtime coverage ordered - Goal blood glucose while inpatient is 140-180  # History of right ovarian mass-patient has not been to an OB/GYN doctor in about 5 years - Patient's husband at bedside states that he does not even know what an OB/GYN doctor is - I extensively counseled the patient and her husband at bedside that patient will need to follow-up with her OB/GYN provider until they would recommend that she no longer needs to see them for the ovarian mask and or regular Pap smear  # Left vulvar dermatitis - POA, triamcinolone cream bid including first dose at 2030,   Chart reviewed.   DVT prophylaxis: Enoxaparin 40 mg subcutaneous nightly Code Status: Full code Diet: N.p.o. pending SLP Family Communication: Updated spouse, Richard at bedside Disposition Plan: Pending clinical course Consults called: None at this time Admission status: Observation, MedSurg, telemetry  Past Medical History:  Diagnosis Date   Depression    Diabetes mellitus without complication ([place])    Dyslipidemia    First degree AV block    History of CVA (cerebrovascular accident)    IBS (irritable bowel syndrome)    Major depressive disorder, recurrent episode, in full remission ([place])    Major depressive disorder, recurrent episode, mild ([place])    Myotonic dystrophy, type 1 (HCC)    OSA (obstructive sleep apnea)    Personal history of Paget's disease of bone    Posttraumatic stress disorder    Recurrent major depression in full remission ([place])    Sinus bradycardia    Stroke [facility])    Past Surgical History:  Procedure Laterality Date   BRAIN SURGERY     brain surgery on Paget's     CATARACT EXTRACTION     COLONOSCOPY     OOPHORECTOMY     RETINAL DETACHMENT SURGERY     TOTAL ABDOMINAL HYSTERECTOMY      Social History:  reports that she has never smoked. She has never used smokeless tobacco. She reports that she does not drink alcohol and does not use drugs.  Allergies  Allergen Reactions   Other Other (See Comments)    Allergen: patch medications, Other Reaction: light headed, cannot function, medicine absorbed too rapidly through muscle   Actos  [Pioglitazone] Diarrhea   Metformin Nausea Only   Family History  Adopted: Yes  Family history unknown: Yes   Family history: Family history reviewed and not pertinent  Prior to Admission medications   Medication Sig Start Date End Date Taking? Authorizing Provider  albuterol (PROAIR HFA) 108 (90 BASE) MCG/ACT inhaler Inhale into the lungs. 12/03/14 01/08/18  [provider]  econazole nitrate 1 % cream Apply topically daily. Apply to affected area in groin 03/16/18   Carrie Mew, MD  glimepiride (AMARYL) 1 MG tablet Take 1 tablet by mouth daily. 02/18/18   [provider]  megestrol (MEGACE ES) 625 MG/5ML suspension Take 5 mLs (625 mg total) by mouth daily.  03/16/18   Carrie Mew, MD  metoCLOPramide (REGLAN) 10 MG tablet Take 1 tablet (10 mg total) by mouth every 6 (six) hours as needed. 03/16/18   Carrie Mew, MD  mirtazapine (REMERON SOL-TAB) 15 MG disintegrating tablet Take 1 tablet (15 mg total) by mouth at bedtime. 02/23/21 08/22/21  Clapacs, Madie Reno, MD  naproxen sodium (ALEVE) 220 MG tablet Take 220 mg by mouth daily as needed.    [provider]  nitrofurantoin, macrocrystal-monohydrate, (MACROBID) 100 MG capsule Take 100 mg by mouth 2 (two) times daily. 03/10/18   [provider]  NON FORMULARY CPAP    [provider]  Red Yeast Rice Extract (RED YEAST RICE PO) Take by mouth daily.    [provider]  sertraline (ZOLOFT) 100 MG tablet Take 1 tablet (100 mg total) by mouth daily. 02/23/21   Clapacs, Madie Reno, MD  traMADol (ULTRAM) 50 MG tablet Take 1 tablet by mouth every 4  (four) hours as needed. 03/01/18   [provider]  zolpidem (AMBIEN CR) 12.5 MG CR tablet TAKE 1 TABLET BY MOUTH AT BEDTIME AS NEEDED FOR SLEEP 03/08/20   Clapacs, Madie Reno, MD  zolpidem (AMBIEN) 10 MG tablet Take 1 tablet (10 mg total) by mouth at bedtime as needed for sleep. 02/23/21   Clapacs, Madie Reno, MD   Physical Exam: Vitals:   05/03/21 1500 05/03/21 1530 05/03/21 1600 05/03/21 1702  BP: 110/69 120/62 112/81 114/77  Pulse: (!) 47 (!) 44 (!) 46 (!) 40  Resp: '11 13 10 11  '$ Temp:      TempSrc:      SpO2: 98% 99% 97% 97%  Weight:      Height:       Constitutional: appears older than chronological age, cachectic, frail, NAD, calm, comfortable Eyes: PERRL, lids and conjunctivae normal ENMT: Mucous membranes are moist. Posterior pharynx clear of any exudate or lesions. Age-appropriate dentition. Hearing appropriate Neck: normal, supple, no masses, no thyromegaly Respiratory: clear to auscultation bilaterally, no wheezing, no crackles. Normal respiratory effort. No accessory muscle use.  Cardiovascular: Regular rate and rhythm, no murmurs / rubs / gallops. No extremity edema. 2+ pedal pulses. No carotid bruits.  Abdomen: no tenderness, no masses palpated, no hepatosplenomegaly. Bowel sounds positive.  Musculoskeletal: no clubbing / cyanosis. No joint deformity upper and lower extremities. no contractures.  Bilateral lower extremity atrophy.  Diffusely decreased muscle tone.  Generalized decreased weakness Skin: no rashes, lesions, ulcers. No induration Neurologic: Sensation intact. Strength 5/5 in all 4.  Psychiatric: Normal judgment and insight. Alert and oriented x 3. Normal mood.   EKG: independently reviewed, showing sinus bradycardia with rate of 49, QTc 483  Chest x-ray on Admission: I personally reviewed and I agree with radiologist reading as below.  CT Head Wo Contrast  Result Date: 05/03/2021 CLINICAL DATA:  Mental status change. EXAM: CT HEAD WITHOUT CONTRAST TECHNIQUE:  Contiguous axial images were obtained from the base of the skull through the vertex without intravenous contrast. COMPARISON:  CT head 03/05/2018 FINDINGS: Brain: Left occipital craniotomy. Chronic encephalomalacia left cerebellum unchanged. No mass lesion. Generalized atrophy. Patchy white matter hypodensity bilaterally similar to the prior study. Negative for acute infarct or hemorrhage. Vascular: Negative for hyperdense vessel Skull: Left occipital craniotomy and cranioplasty. Sinuses/Orbits: Negative Other: None IMPRESSION: No acute abnormality no change from the prior study Left occipital craniotomy with postsurgical encephalomalacia left cerebellum, stable. Electronically Signed   By: Franchot Gallo M.D.   On: 05/03/2021 16:47   DG  Chest Port 1 View  Result Date: 05/03/2021 CLINICAL DATA:  Altered mental status EXAM: PORTABLE CHEST 1 VIEW COMPARISON:  Radiograph 03/15/2018, chest CT 10/30/2011 FINDINGS: Unchanged cardiomediastinal silhouette. There is no focal airspace consolidation. There is no large pleural effusion or visible pneumothorax. There is no acute osseous abnormality. IMPRESSION: No focal airspace disease. Electronically Signed   By: Maurine Simmering   On: 05/03/2021 18:43    Labs on Admission: I have personally reviewed following labs  CBC: Recent Labs  Lab 05/03/21 1131  WBC 7.2  HGB 14.4  HCT 41.8  MCV 88.9  PLT 99991111   Basic Metabolic Panel: Recent Labs  Lab 05/03/21 1131  NA 140  K 4.1  CL 102  CO2 31  GLUCOSE 102*  BUN 16  CREATININE 0.48  CALCIUM 10.0   GFR: Estimated Creatinine Clearance: 61.1 mL/min (by C-G formula based on SCr of 0.48 mg/dL).  Liver Function Tests: Recent Labs  Lab 05/03/21 1131  AST 21  ALT 15  ALKPHOS 86  BILITOT 0.6  PROT 7.4  ALBUMIN 3.4*   Urine analysis:    Component Value Date/Time   COLORURINE YELLOW (A) 05/03/2021 1440   APPEARANCEUR CLEAR (A) 05/03/2021 1440   APPEARANCEUR Cloudy 10/28/2011 2027   LABSPEC 1.014  05/03/2021 1440   LABSPEC 1.016 10/28/2011 2027   PHURINE 5.0 05/03/2021 1440   GLUCOSEU NEGATIVE 05/03/2021 1440   GLUCOSEU Negative 10/28/2011 2027   HGBUR MODERATE (A) 05/03/2021 1440   BILIRUBINUR NEGATIVE 05/03/2021 1440   BILIRUBINUR Negative 10/28/2011 2027   KETONESUR NEGATIVE 05/03/2021 1440   PROTEINUR NEGATIVE 05/03/2021 1440   NITRITE NEGATIVE 05/03/2021 1440   LEUKOCYTESUR NEGATIVE 05/03/2021 1440   LEUKOCYTESUR Trace 10/28/2011 2027   Dr. Tobie Poet [facility]  If 7PM-7AM, please contact overnight-coverage provider If 7AM-7PM, please contact day coverage provider [email or web address]  05/03/2021, 7:25 PM

## 2021-05-03 NOTE — ED Notes (Signed)
Pt has returned from MRI. 

## 2021-05-03 NOTE — ED Notes (Signed)
This RN went into room to perform the bedside swallow evaluation and to get her cleaned up after she urinated into her brief. This RN informed them that due to staffing issues she will have to be moved into a hallway bed on the other side of the unit. The patient's husband immediately stated "I am taking her home. I can take better care of her at home by myself." I tried to convince them to stay by stating I would speak to the charge RN to see about getting her into a regular ED room instead of a hallway bed but he responded with "I am taking her home. It is clear you do not have the staff to take care of her here." I informed them that she will still get the care she needs but unfortunately the hospital is low on nurses and that's why the unit POD C is closing as well as why she has not been assigned an inpatient hospital bed yet. I reassured them that she will still be taken care of and there will be a nurse assigned to them. I also reviewed the risks of leaving AMA with them. He then asked his wife if she wanted to stay or go home and she stated she wanted to go home and didn't want to come to the hospital in the first place. This RN contacted Dr. Tobie Poet via epic chat to informed them of the patient's choice to go home. This RN proceeded to clean the patient up. Peri care performed, all linen changed and new clean dry brief and paper scrub bottoms placed on the patient. When that was finished Dr. Tobie Poet came to the bedside and attempted to get the patient to stay and also went over the risks of leaving AMA. See her notes for that. The patient and her husband decided to leave but her husband stated he will take her to see her PCP.

## 2021-05-03 NOTE — ED Triage Notes (Signed)
Pt to ED with husband for AMS that started a few days ago, husband reports this happens when she gets UTI's.  Pt reports her bottoms hurts and lower back.  Husband reports foul smelling urine  Husband reports disoriented at baseline  Pt in NAD, RR even and unlabored

## 2021-05-03 NOTE — ED Notes (Signed)
Report given to [place], RN

## 2021-05-03 NOTE — ED Notes (Signed)
In and out cath performed. Urine sent to lab.

## 2021-05-03 NOTE — ED Provider Notes (Signed)
[facility] Emergency Department Provider Note  ____________________________________________   Event Date/Time   First MD Initiated Contact with Patient 05/03/21 1320     (approximate)  I have reviewed the triage vital signs and the nursing notes.   HISTORY  Chief Complaint Altered Mental Status  HPI Rebekah Warner is a 70 y.o. female with past medical history including muscular dystrophy, CVA requiring craniotomy, diabetes who reports to the emergency department with husband for evaluation of altered mental status.  Husband reports that at baseline, she has some mild abnormal mental status but reports that he has noticed a decline over the last 2 weeks.  He reports this is similar to the last time that she had a UTI.  She is endorsing pain to her "bottom" but is difficult to differentiate whether this is urinary, anal, sacral.  They deny any known fevers, chills.  Husband does report that he regularly checks her oxygenation at home which also displays a pulse usually in the 70s.         Past Medical History:  Diagnosis Date   Depression    Diabetes mellitus without complication ([place])    Dyslipidemia    First degree AV block    History of CVA (cerebrovascular accident)    IBS (irritable bowel syndrome)    Major depressive disorder, recurrent episode, in full remission ([place])    Major depressive disorder, recurrent episode, mild ([place])    Myotonic dystrophy, type 1 (HCC)    OSA (obstructive sleep apnea)    Personal history of Paget's disease of bone    Posttraumatic stress disorder    Recurrent major depression in full remission ([place])    Sinus bradycardia    Stroke [facility])     Patient Active Problem List   Diagnosis Date Noted   Altered mental status 05/03/2021   UTI (urinary tract infection) 03/05/2018   Severe episode of recurrent major depressive disorder, without psychotic features ([place]) 05/09/2016   Vaccine counseling 03/22/2016   Ovarian  mass, right 07/20/2015   Adnexal pain 06/27/2015   Abdominal pain, right lower quadrant 06/27/2015   Neurosis, posttraumatic 02/11/2015   Depression, major, recurrent, mild ([place]) 02/11/2015   Depression, major, recurrent, in complete remission ([place]) 02/11/2015   Diabetes type 2, controlled ([place]) 10/18/2014   Acute cerebellar ataxia ([place]) 09/29/2014   Cerebellar ataxia ([place]) 09/29/2014   Insomnia, persistent 08/11/2014   Diabetes mellitus, type 2 ([place]) 08/11/2014   Borderline diabetes mellitus 08/11/2014   Fatigue 04/26/2014   Adiposity 04/26/2014   Vision disturbance 04/26/2014   Unspecified visual disturbance 04/26/2014   Hyperlipidemia 11/02/2013   Muscular dystrophy, myotonic ([place]) 11/02/2013   CVA (cerebral vascular accident) ([place]) 11/02/2013   Cerebral artery occlusion with cerebral infarction ([place]) 11/02/2013   Bradycardia 10/16/2013   First degree atrioventricular block 09/25/2013   1st degree AV block 09/25/2013   Cardiac arrhythmia 10/30/2012   Bradycardia, sinus 10/30/2012   Depressive disorder 10/10/2012   Transient ischemic attack (TIA), and cerebral infarction without residual deficits 10/10/2012   Irritable colon 10/10/2012   Obstructive sleep apnea 10/10/2012   Osteitis deformans 10/10/2012   Shortness of breath 10/10/2012   Clinical depression 10/10/2012   Cerebrovascular accident, old 10/10/2012   Dyslipidemia 10/10/2012   Obstructive apnea 10/10/2012   Recurrent major depression in remission ([place]) 10/10/2012   Myotonic muscular dystrophy ([place]) 09/19/2012   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  Authorizing Provider  albuterol (PROAIR HFA) 108 (90 BASE) MCG/ACT inhaler Inhale into the lungs. 12/03/14 01/08/18  [provider]  econazole nitrate 1 % cream Apply topically daily. Apply to affected area in groin 03/16/18   Carrie Mew, MD  glimepiride (AMARYL) 1 MG tablet Take 1 tablet by mouth daily. 02/18/18   [provider]  megestrol (MEGACE ES) 625 MG/5ML suspension Take 5 mLs (625 mg total) by mouth daily. 03/16/18   Carrie Mew, MD  metoCLOPramide (REGLAN) 10 MG tablet Take 1 tablet (10 mg total) by mouth every 6 (six) hours as needed. 03/16/18   Carrie Mew, MD  mirtazapine (REMERON SOL-TAB) 15 MG disintegrating tablet Take 1 tablet (15 mg total) by mouth at bedtime. 02/23/21 08/22/21  Clapacs, Madie Reno, MD  naproxen sodium (ALEVE) 220 MG tablet Take 220 mg by mouth daily as needed.    [provider]  nitrofurantoin, macrocrystal-monohydrate, (MACROBID) 100 MG capsule Take 100 mg by mouth 2 (two) times daily. 03/10/18   [provider]  NON FORMULARY CPAP    [provider]  Red Yeast Rice Extract (RED YEAST RICE PO) Take by mouth daily.    [provider]  sertraline (ZOLOFT) 100 MG tablet Take 1 tablet (100 mg total) by mouth daily. 02/23/21   Clapacs, Madie Reno, MD  traMADol (ULTRAM) 50 MG tablet Take 1 tablet by mouth every 4 (four) hours as needed. 03/01/18   [provider]  zolpidem (AMBIEN CR) 12.5 MG CR tablet TAKE 1 TABLET BY MOUTH AT BEDTIME AS NEEDED FOR SLEEP 03/08/20   Clapacs, Madie Reno, MD  zolpidem (AMBIEN) 10 MG tablet Take 1 tablet (10 mg total) by mouth at bedtime as needed for sleep. 02/23/21   Clapacs, Madie Reno, MD    Allergies Other, Actos  [pioglitazone], and Metformin  Family History  Adopted: Yes  Family history unknown: Yes    Social History Social History   Tobacco Use   Smoking status: Never   Smokeless tobacco: Never  Vaping Use   Vaping Use: Never used  Substance Use Topics   Alcohol  use: Never   Drug use: Never    Review of Systems Constitutional: No fever/chills Eyes: No visual changes. ENT: No sore throat. Cardiovascular: Denies chest pain. Respiratory: Denies shortness of breath. Gastrointestinal: No abdominal pain.  No nausea, no vomiting.  No diarrhea.  No constipation. Genitourinary: Negative for dysuria. Musculoskeletal: Negative for back pain. Skin: Negative for rash. Neurological: Negative for headaches, focal weakness or numbness. + Generalized mental status change  ____________________________________________   PHYSICAL EXAM:  VITAL SIGNS: ED Triage Vitals [05/03/21 1130]  Enc Vitals Group     BP 105/68     Pulse Rate (!) 56     Resp 16     Temp 97.8 F (36.6 C)     Temp Source Oral     SpO2 95 %     Weight 125 lb (56.7 kg)     Height '5\' 7"'$  (1.702 m)     Head Circumference      Peak Flow      Pain Score      Pain  Loc      Pain Edu?      Excl. in [place]?    Constitutional: Alert and oriented.  Chronically ill-appearing but in no acute distress. Eyes: Conjunctivae are normal. PERRL. EOMI. Head: Atraumatic. Nose: No congestion/rhinnorhea. Mouth/Throat: Mucous membranes are moist.  Oropharynx non-erythematous. Neck: No stridor.   Cardiovascular: Bradycardic, regular rhythm. Grossly normal heart sounds.  Good peripheral circulation. Respiratory: Normal respiratory effort.  No retractions. Lungs CTAB. Gastrointestinal: Soft and nontender. No distention. No abdominal bruits. No CVA tenderness. Genitourinary: External female genitalia is erythematous and tender Musculoskeletal: No lower extremity tenderness nor edema.  No joint effusions. Neurologic: Delayed speech and language. No gross focal neurologic deficits are appreciated.  Chronically bedbound, no changes. Skin:  Skin is warm, dry and intact.  There are grade 1 pressure skin changes on the bilateral sacral region. Psychiatric: Mood and affect are normal. Speech and behavior are  normal.  ____________________________________________   LABS (all labs ordered are listed, but only abnormal results are displayed)  Labs Reviewed  COMPREHENSIVE METABOLIC PANEL - Abnormal; Notable for the following components:      Result Value   Glucose, Bld 102 (*)    Albumin 3.4 (*)    All other components within normal limits  URINALYSIS, COMPLETE (UACMP) WITH MICROSCOPIC - Abnormal; Notable for the following components:   Color, Urine YELLOW (*)    APPearance CLEAR (*)    Hgb urine dipstick MODERATE (*)    All other components within normal limits  CBC  HIV ANTIBODY (ROUTINE TESTING W REFLEX)  CBC  TSH  COMPREHENSIVE METABOLIC PANEL  AMMONIA  VITAMIN B12  HEMOGLOBIN A1C  TROPONIN I (HIGH SENSITIVITY)  TROPONIN I (HIGH SENSITIVITY)   ____________________________________________  EKG  Sinus bradycardia with a rate of 49 bpm.  Left bundle branch block present.  No ST elevations or depressions. ____________________________________________  RADIOLOGY  Official radiology report(s): CT Head Wo Contrast  Result Date: 05/03/2021 CLINICAL DATA:  Mental status change. EXAM: CT HEAD WITHOUT CONTRAST TECHNIQUE: Contiguous axial images were obtained from the base of the skull through the vertex without intravenous contrast. COMPARISON:  CT head 03/05/2018 FINDINGS: Brain: Left occipital craniotomy. Chronic encephalomalacia left cerebellum unchanged. No mass lesion. Generalized atrophy. Patchy white matter hypodensity bilaterally similar to the prior study. Negative for acute infarct or hemorrhage. Vascular: Negative for hyperdense vessel Skull: Left occipital craniotomy and cranioplasty. Sinuses/Orbits: Negative Other: None IMPRESSION: No acute abnormality no change from the prior study Left occipital craniotomy with postsurgical encephalomalacia left cerebellum, stable. Electronically Signed   By: Franchot Gallo M.D.   On: 05/03/2021 16:47     ____________________________________________   INITIAL IMPRESSION / ASSESSMENT AND PLAN / ED COURSE  As part of my medical decision making, I reviewed the following data within the [place] History obtained from family, Nursing notes reviewed and incorporated, Labs reviewed, Discussed with admitting physician Dr. Tobie Poet, and Notes from prior ED visits        Patient is a 70 year old female who reports to the emergency department for evaluation of altered mental status as reported by the husband with decline in the last 2 weeks, see HPI for further details.  In triage patient is mildly bradycardic with a rate of 56, otherwise vitals are within normal limits.  Physical exam as above.  CBC and CMP was obtained from triage with very mild decrease in albumin otherwise within normal limits.  Urinalysis was obtained and is positive for moderate number of hemoglobin, however otherwise  is grossly normal.  Upon reevaluation in the room, the patient's heart monitor was noted to be increasingly bradycardic, as low as 40.  EKG was obtained demonstrates sinus bradycardia but with no ST changes.  Troponin was obtained and is within normal limits.  At this time, will admit the patient for symptomatic bradycardia of unknown etiology causing some reported mental status changes.  Case was discussed with Dr. Tobie Poet, who agrees to admit the patient.  Patient stable this time for transfer to the floor.      ____________________________________________   FINAL CLINICAL IMPRESSION(S) / ED DIAGNOSES  Final diagnoses:  Symptomatic bradycardia  Transient alteration of awareness     ED Discharge Orders     None        Note:  This document was prepared using Dragon voice recognition software and may include unintentional dictation errors.    Marlana Salvage, PA 05/03/21 Virl Cagey    Naaman Plummer, MD 05/03/21 [phone]

## 2021-05-03 NOTE — ED Notes (Signed)
Applied Purewick to patient.

## 2021-05-03 NOTE — Discharge Summary (Signed)
Received message from nursing staff that patient wants to leave [facility].  I presented to patient's bedside and extensively counseled patient and her husband who is healthcare power of attorney.  Patient is tearful and demands to be taken home.  She states she does not want to be here anymore.  She states that she does not like the bed, the food and does not like that she does not have a room in the hospital yet.  Her husband attempts to soothe her, smooth her hair, and tries to get her to stay.  I explained the risk the risk of leaving the hospital including death.  Patient is adamant about leaving.  As she is clinically stable I recommended the patient follow-up with her primary care doctor including B12 and echo cardiogram.  Dr. Tobie Poet

## 2021-05-03 NOTE — ED Notes (Signed)
Patient transported to MRI 

## 2021-05-05 LAB — HEMOGLOBIN A1C
Hgb A1c MFr Bld: 5.1 % (ref 4.8–5.6)
Mean Plasma Glucose: 100 mg/dL

## 2021-08-24 ENCOUNTER — Other Ambulatory Visit: Payer: Self-pay

## 2021-08-24 ENCOUNTER — Telehealth (INDEPENDENT_AMBULATORY_CARE_PROVIDER_SITE_OTHER): Payer: Medicare HMO | Admitting: Psychiatry

## 2021-08-24 DIAGNOSIS — F341 Dysthymic disorder: Secondary | ICD-10-CM | POA: Diagnosis not present

## 2021-08-24 DIAGNOSIS — F4310 Post-traumatic stress disorder, unspecified: Secondary | ICD-10-CM

## 2021-08-24 DIAGNOSIS — F332 Major depressive disorder, recurrent severe without psychotic features: Secondary | ICD-10-CM | POA: Diagnosis not present

## 2021-08-24 MED ORDER — ZOLPIDEM TARTRATE 10 MG PO TABS
10.0000 mg | ORAL_TABLET | Freq: Every evening | ORAL | 1 refills | Status: AC | PRN
Start: 2021-08-24 — End: ?

## 2021-08-24 MED ORDER — SERTRALINE HCL 100 MG PO TABS: 100.0000 mg | ORAL_TABLET | Freq: Every day | ORAL | 1 refills | Status: AC

## 2021-08-24 NOTE — Progress Notes (Signed)
Virtual Visit via Telephone Note  I connected with [name] on 08/24/21 at  1:40 PM EST by telephone and verified that I am speaking with the correct person using two identifiers.  Location: Patient: Home Provider: Hospital   I discussed the limitations, risks, security and privacy concerns of performing an evaluation and management service by telephone and the availability of in person appointments. I also discussed with the patient that there may be a patient responsible charge related to this service. The patient expressed understanding and agreed to proceed.   History of Present Illness: Patient with multiple severe chronic problems including chronic neurologic dysfunction and chronic pain.  Reached by telephone.  Spoke with husband and patient.  Chief complaint currently reportedly is her chronic pain in her coccyx.  Patient has been resistant to getting follow-up medical care for it.  They do report that she is sleeping adequately.  Also able to get up out of bed and get into a chair every day.  Has some quality of life and enjoyment still.  No report of any suicidal thought.    Observations/Objective: Spoke with patient.  She was appropriate but as usual quiet and slow.  Denies any suicidal thought.  No indication of psychosis   Assessment and Plan: Offered empathy and support.  Reviewed with them the medication and that she is now able to take the Zoloft regularly.  Continue medications and everything will be refilled right now.  Follow up in another 6 months   Follow Up Instructions:    I discussed the assessment and treatment plan with the patient. The patient was provided an opportunity to ask questions and all were answered. The patient agreed with the plan and demonstrated an understanding of the instructions.   The patient was advised to call back or seek an in-person evaluation if the symptoms worsen or if the condition fails to improve as anticipated.  I provided 20  minutes of non-face-to-face time during this encounter.   Alethia Berthold, MD

## 2022-02-05 DEATH — deceased
